# Patient Record
Sex: Male | Born: 1970 | Race: White | Hispanic: No
Health system: Midwestern US, Community
[De-identification: ages and names within clinical notes are randomized; demographics above are authoritative.]

## PROBLEM LIST (undated history)

## (undated) DIAGNOSIS — M5412 Radiculopathy, cervical region: Principal | ICD-10-CM

## (undated) DIAGNOSIS — K219 Gastro-esophageal reflux disease without esophagitis: Secondary | ICD-10-CM

## (undated) DIAGNOSIS — K589 Irritable bowel syndrome without diarrhea: Secondary | ICD-10-CM

## (undated) HISTORY — PX: BACK SURGERY: SHX140

---

## 1999-02-06 ENCOUNTER — Encounter: Payer: Self-pay | Admitting: Neurosurgery

## 1999-02-06 ENCOUNTER — Ambulatory Visit (HOSPITAL_COMMUNITY): Admission: RE | Admit: 1999-02-06 | Discharge: 1999-02-06 | Payer: Self-pay | Admitting: Neurosurgery

## 2004-07-06 ENCOUNTER — Ambulatory Visit: Payer: Self-pay | Admitting: Unknown Physician Specialty

## 2004-11-23 ENCOUNTER — Emergency Department: Payer: Self-pay | Admitting: General Practice

## 2005-02-24 ENCOUNTER — Emergency Department: Payer: Self-pay | Admitting: Unknown Physician Specialty

## 2005-02-24 ENCOUNTER — Other Ambulatory Visit: Payer: Self-pay

## 2005-12-28 ENCOUNTER — Emergency Department: Payer: Self-pay | Admitting: Emergency Medicine

## 2006-03-12 ENCOUNTER — Emergency Department: Payer: Self-pay | Admitting: General Practice

## 2006-07-06 ENCOUNTER — Emergency Department: Payer: Self-pay | Admitting: Internal Medicine

## 2006-07-07 ENCOUNTER — Emergency Department: Payer: Self-pay | Admitting: Emergency Medicine

## 2006-10-04 ENCOUNTER — Emergency Department: Payer: Self-pay | Admitting: Emergency Medicine

## 2006-10-06 ENCOUNTER — Emergency Department: Payer: Self-pay | Admitting: Unknown Physician Specialty

## 2006-10-15 ENCOUNTER — Emergency Department: Payer: Self-pay | Admitting: Emergency Medicine

## 2007-08-25 ENCOUNTER — Emergency Department: Payer: Self-pay | Admitting: Unknown Physician Specialty

## 2007-08-25 ENCOUNTER — Other Ambulatory Visit: Payer: Self-pay

## 2007-09-14 ENCOUNTER — Emergency Department: Payer: Self-pay | Admitting: Emergency Medicine

## 2007-12-31 ENCOUNTER — Emergency Department: Payer: Self-pay | Admitting: Emergency Medicine

## 2008-03-16 ENCOUNTER — Emergency Department: Payer: Self-pay | Admitting: Emergency Medicine

## 2008-03-19 ENCOUNTER — Emergency Department: Payer: Self-pay | Admitting: Emergency Medicine

## 2008-05-07 ENCOUNTER — Emergency Department: Payer: Self-pay | Admitting: Internal Medicine

## 2008-05-07 ENCOUNTER — Emergency Department: Payer: Self-pay | Admitting: Emergency Medicine

## 2008-05-07 ENCOUNTER — Other Ambulatory Visit: Payer: Self-pay

## 2008-07-09 ENCOUNTER — Emergency Department: Payer: Self-pay | Admitting: Emergency Medicine

## 2008-07-23 ENCOUNTER — Emergency Department: Payer: Self-pay | Admitting: Internal Medicine

## 2009-05-22 ENCOUNTER — Emergency Department: Payer: Self-pay

## 2009-08-07 ENCOUNTER — Emergency Department: Payer: Self-pay | Admitting: Emergency Medicine

## 2009-10-22 ENCOUNTER — Observation Stay: Payer: Self-pay | Admitting: Internal Medicine

## 2010-01-01 ENCOUNTER — Emergency Department: Payer: Self-pay | Admitting: Emergency Medicine

## 2012-08-26 ENCOUNTER — Emergency Department: Payer: Self-pay | Admitting: Emergency Medicine

## 2012-08-26 LAB — URINALYSIS, COMPLETE
Bacteria: NONE SEEN
Glucose,UR: NEGATIVE mg/dL (ref 0–75)
Leukocyte Esterase: NEGATIVE
Nitrite: NEGATIVE
Protein: NEGATIVE
RBC,UR: 1 /HPF (ref 0–5)
Specific Gravity: 1.026 (ref 1.003–1.030)
Squamous Epithelial: NONE SEEN
WBC UR: 1 /HPF (ref 0–5)

## 2012-08-26 LAB — COMPREHENSIVE METABOLIC PANEL
Albumin: 4.9 g/dL (ref 3.4–5.0)
Alkaline Phosphatase: 71 U/L (ref 50–136)
Anion Gap: 9 (ref 7–16)
BUN: 13 mg/dL (ref 7–18)
Chloride: 107 mmol/L (ref 98–107)
Creatinine: 1.1 mg/dL (ref 0.60–1.30)
Glucose: 89 mg/dL (ref 65–99)
SGOT(AST): 34 U/L (ref 15–37)
SGPT (ALT): 47 U/L (ref 12–78)

## 2012-08-26 LAB — CBC
HCT: 42.4 % (ref 40.0–52.0)
MCHC: 33.9 g/dL (ref 32.0–36.0)
MCV: 85 fL (ref 80–100)
Platelet: 263 10*3/uL (ref 150–440)
RDW: 13.4 % (ref 11.5–14.5)
WBC: 15.9 10*3/uL — ABNORMAL HIGH (ref 3.8–10.6)

## 2012-08-26 LAB — TROPONIN I: Troponin-I: <0.02 ng/mL

## 2013-02-03 ENCOUNTER — Emergency Department: Payer: Self-pay | Admitting: Internal Medicine

## 2013-06-30 ENCOUNTER — Emergency Department: Payer: Self-pay | Admitting: Emergency Medicine

## 2013-06-30 LAB — URINALYSIS, COMPLETE
Ketone: NEGATIVE
Leukocyte Esterase: NEGATIVE
Nitrite: NEGATIVE
Ph: 6 (ref 4.5–8.0)
Specific Gravity: 1.023 (ref 1.003–1.030)

## 2013-06-30 LAB — COMPREHENSIVE METABOLIC PANEL
Anion Gap: 4 — ABNORMAL LOW (ref 7–16)
Bilirubin,Total: 0.3 mg/dL (ref 0.2–1.0)
Chloride: 105 mmol/L (ref 98–107)
EGFR (African American): >60
EGFR (Non-African Amer.): >60
Glucose: 115 mg/dL — ABNORMAL HIGH (ref 65–99)
SGPT (ALT): 56 U/L (ref 12–78)

## 2013-06-30 LAB — CBC
HCT: 37.9 % — ABNORMAL LOW (ref 40.0–52.0)
HGB: 13.4 g/dL (ref 13.0–18.0)
MCH: 29.3 pg (ref 26.0–34.0)
MCHC: 35.4 g/dL (ref 32.0–36.0)
RBC: 4.58 10*6/uL (ref 4.40–5.90)
WBC: 8.7 10*3/uL (ref 3.8–10.6)

## 2013-06-30 LAB — DRUG SCREEN, URINE
Barbiturates, Ur Screen: NEGATIVE (ref ?–200)
MDMA (Ecstasy)Ur Screen: NEGATIVE (ref ?–500)
Methadone, Ur Screen: NEGATIVE (ref ?–300)
Tricyclic, Ur Screen: NEGATIVE (ref ?–1000)

## 2013-06-30 LAB — CK TOTAL AND CKMB (NOT AT ARMC)
CK, Total: 227 U/L (ref 35–232)
CK-MB: 3.7 ng/mL — ABNORMAL HIGH (ref 0.5–3.6)

## 2013-06-30 LAB — TROPONIN I: Troponin-I: <0.02 ng/mL

## 2013-07-04 ENCOUNTER — Ambulatory Visit (INDEPENDENT_AMBULATORY_CARE_PROVIDER_SITE_OTHER): Payer: Self-pay | Admitting: Physician Assistant

## 2013-07-04 ENCOUNTER — Encounter: Payer: Self-pay | Admitting: Physician Assistant

## 2013-07-04 VITALS — BP 110/80 | HR 69 | Ht 67.0 in | Wt 141.2 lb

## 2013-07-04 DIAGNOSIS — R0602 Shortness of breath: Secondary | ICD-10-CM

## 2013-07-04 DIAGNOSIS — M79609 Pain in unspecified limb: Secondary | ICD-10-CM

## 2013-07-04 DIAGNOSIS — M79602 Pain in left arm: Secondary | ICD-10-CM

## 2013-07-04 NOTE — Patient Instructions (Signed)
Dr. Mariah Milling will review your stress test findings and will update you with the results. Any further recommendations will be made at that time.

## 2013-07-04 NOTE — Procedures (Signed)
Exercise Treadmill Test  Pre-Exercise Testing Evaluation Rhythm: normal sinus  Rate: 72   PR:  .12 QRS:  .06  QT:  .34      ST Segments:  no significant ST changes at rest     Test  Exercise Tolerance Test Ordering MD: Dossie Arbour, MD  Interpreting MD: Dossie Arbour, MD      Indication for ETT: dyspnea, bilateral arm numbness  Contraindication to ETT: No   Stress Modality: exercise - treadmill  Cardiac Imaging Performed: non   Protocol: standard Bruce - maximal  Max BP:  172/78  Max MPHR (bpm):  176 85% MPR (bpm):  149  MPHR obtained (bpm):  173 % MPHR obtained:  98  Reached 85% MPHR (min:sec):  4:23 Total Exercise Time (min-sec): 6:59  Workload in METS:  10.1   Reason ETT Terminated:  patient's desire to stop    ST Segment Analysis At Rest: normal ST segments - no evidence of significant ST depression With Exercise: diffuse upsloping ST depressions  Other Information Arrhythmia:  No Angina during ETT:  absent (0) Quality of ETT:  diagnostic    Comments:  FINAL IMPRESSION: Normal exercise stress test. No significant EKG changes concerning for ischemia. Excellent exercise tolerance.

## 2013-09-05 ENCOUNTER — Emergency Department: Payer: Self-pay | Admitting: Emergency Medicine

## 2013-09-05 LAB — URINALYSIS, COMPLETE
Blood: NEGATIVE
Glucose,UR: NEGATIVE mg/dL (ref 0–75)
Ketone: NEGATIVE
Leukocyte Esterase: NEGATIVE
Nitrite: NEGATIVE
Protein: NEGATIVE
RBC,UR: <1 /HPF (ref 0–5)
Specific Gravity: 1.019 (ref 1.003–1.030)
Squamous Epithelial: NONE SEEN

## 2013-09-05 LAB — BASIC METABOLIC PANEL
Calcium, Total: 9.2 mg/dL (ref 8.5–10.1)
Chloride: 105 mmol/L (ref 98–107)
Co2: 21 mmol/L (ref 21–32)
Glucose: 86 mg/dL (ref 65–99)
Sodium: 137 mmol/L (ref 136–145)

## 2013-09-05 LAB — CBC WITH DIFFERENTIAL/PLATELET
Basophil %: 0.7 %
Eosinophil #: 0.3 10*3/uL (ref 0.0–0.7)
Eosinophil %: 3.9 %
HCT: 38.4 % — ABNORMAL LOW (ref 40.0–52.0)
Lymphocyte #: 1.3 10*3/uL (ref 1.0–3.6)
MCHC: 35.1 g/dL (ref 32.0–36.0)
Monocyte #: 1.2 x10 3/mm — ABNORMAL HIGH (ref 0.2–1.0)
Monocyte %: 17.7 %
Platelet: 212 10*3/uL (ref 150–440)
RDW: 13.6 % (ref 11.5–14.5)
WBC: 6.8 10*3/uL (ref 3.8–10.6)

## 2013-09-05 LAB — TROPONIN I: Troponin-I: <0.02 ng/mL

## 2013-10-11 ENCOUNTER — Emergency Department: Payer: Self-pay | Admitting: Emergency Medicine

## 2013-10-11 LAB — CBC WITH DIFFERENTIAL/PLATELET
BASOS ABS: 0 10*3/uL (ref 0.0–0.1)
BASOS PCT: 0.3 %
EOS PCT: 0.2 %
Eosinophil #: 0 10*3/uL (ref 0.0–0.7)
HCT: 38.9 % — ABNORMAL LOW (ref 40.0–52.0)
HGB: 13.4 g/dL (ref 13.0–18.0)
Lymphocyte #: 0.6 10*3/uL — ABNORMAL LOW (ref 1.0–3.6)
Lymphocyte %: 4.8 %
MCH: 28.4 pg (ref 26.0–34.0)
MCHC: 34.5 g/dL (ref 32.0–36.0)
MCV: 82 fL (ref 80–100)
MONO ABS: 0.9 x10 3/mm (ref 0.2–1.0)
MONOS PCT: 6.7 %
NEUTROS PCT: 88 %
Neutrophil #: 11.6 10*3/uL — ABNORMAL HIGH (ref 1.4–6.5)
Platelet: 227 10*3/uL (ref 150–440)
RBC: 4.73 10*6/uL (ref 4.40–5.90)
RDW: 13.6 % (ref 11.5–14.5)
WBC: 13.2 10*3/uL — ABNORMAL HIGH (ref 3.8–10.6)

## 2013-10-11 LAB — COMPREHENSIVE METABOLIC PANEL
ALBUMIN: 4.1 g/dL (ref 3.4–5.0)
ALK PHOS: 81 U/L
ALT: 112 U/L — AB (ref 12–78)
AST: 54 U/L — AB (ref 15–37)
Anion Gap: 8 (ref 7–16)
BILIRUBIN TOTAL: 0.5 mg/dL (ref 0.2–1.0)
BUN: 11 mg/dL (ref 7–18)
CALCIUM: 8.8 mg/dL (ref 8.5–10.1)
Chloride: 106 mmol/L (ref 98–107)
Co2: 22 mmol/L (ref 21–32)
Creatinine: 1.12 mg/dL (ref 0.60–1.30)
Glucose: 96 mg/dL (ref 65–99)
Osmolality: 271 (ref 275–301)
POTASSIUM: 3.7 mmol/L (ref 3.5–5.1)
Sodium: 136 mmol/L (ref 136–145)
TOTAL PROTEIN: 8.3 g/dL — AB (ref 6.4–8.2)

## 2013-10-11 LAB — LIPASE, BLOOD: LIPASE: 172 U/L (ref 73–393)

## 2014-04-02 NOTE — Telephone Encounter (Signed)
This encounter was created in error - please disregard.

## 2014-04-05 ENCOUNTER — Emergency Department: Payer: Self-pay | Admitting: Emergency Medicine

## 2014-08-14 ENCOUNTER — Emergency Department: Payer: Self-pay | Admitting: Emergency Medicine

## 2014-09-13 IMAGING — CR DG CHEST 1V PORT
1 series · 1 of 1 positions shown · non-contrast
Comparison: none

REASON FOR EXAM: weak and dizzy
COMMENTS:

PROCEDURE:     DXR - DXR PORTABLE CHEST SINGLE VIEW  - June 30, 2013  [DATE]
RESULT:     Comparison made to prior study of 9 [DATE]. Mediastinum and
hilar structures are normal. Lungs are clear. Heart size normal.

[ap]
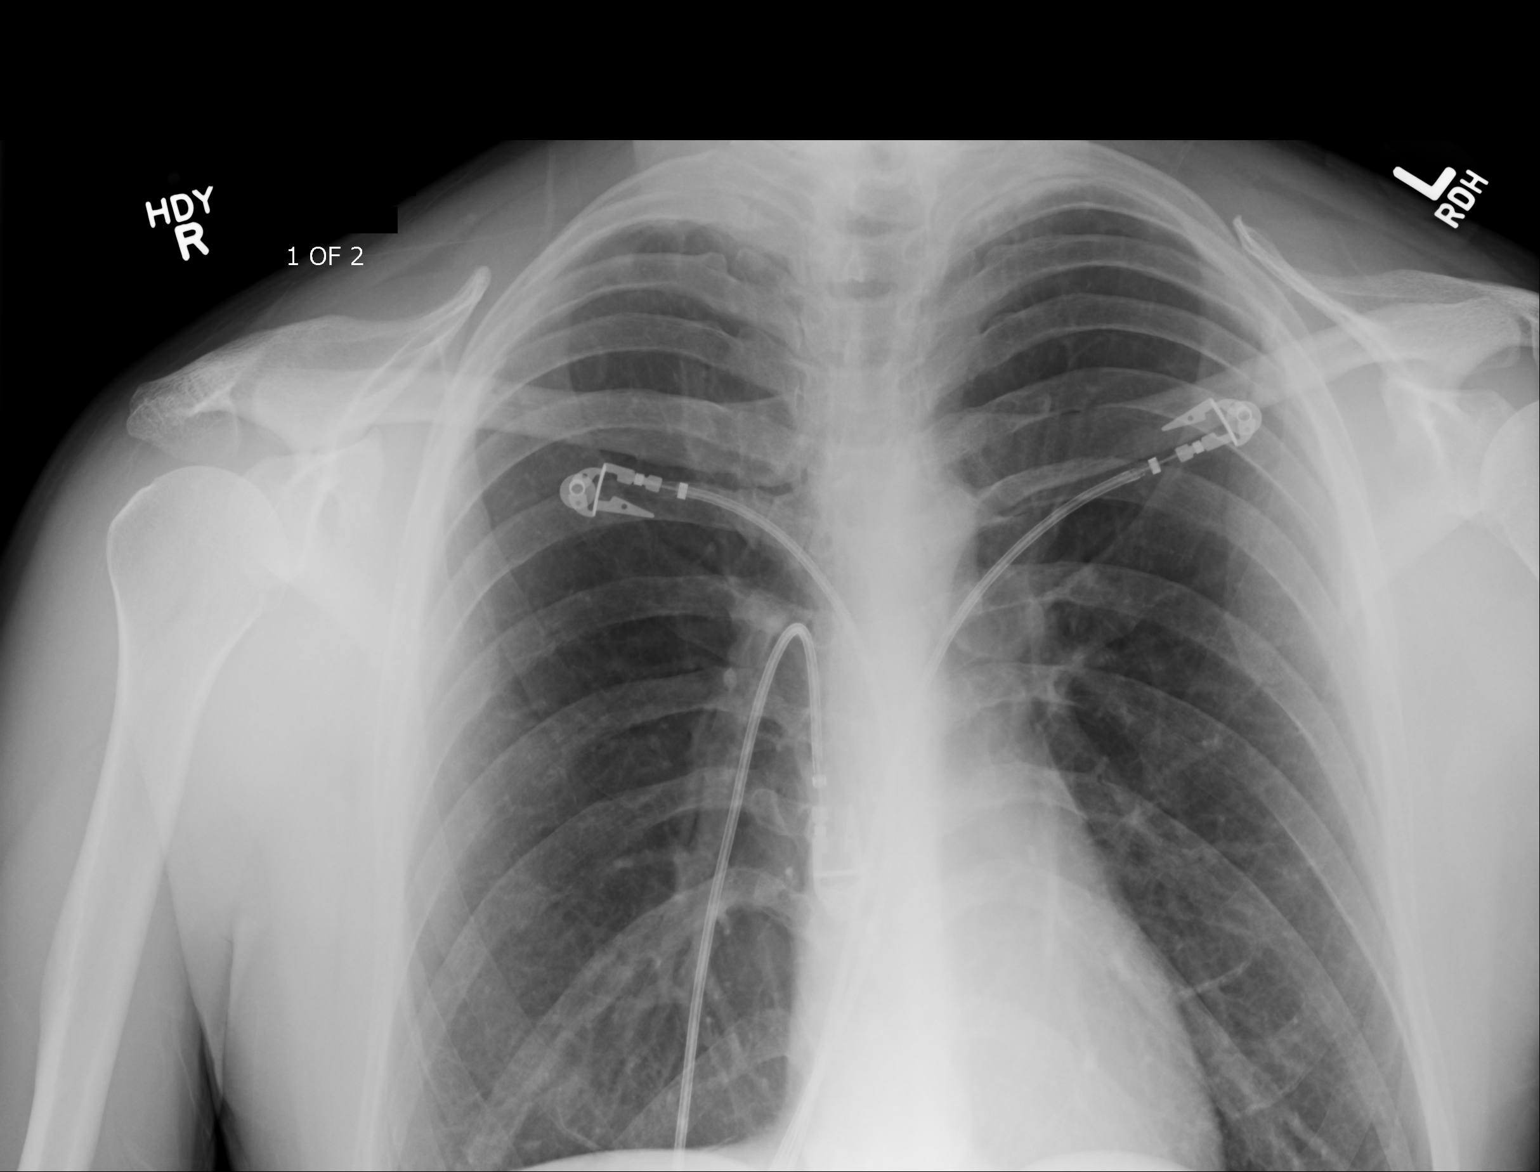

[1 of 1 positions shown; findings below may reference images not displayed]

IMPRESSION: No acute abnormality.

## 2014-10-08 ENCOUNTER — Emergency Department: Payer: Self-pay | Admitting: Emergency Medicine

## 2015-08-10 ENCOUNTER — Emergency Department
Admission: EM | Admit: 2015-08-10 | Discharge: 2015-08-10 | Disposition: A | Discharge status: Home / Self Care | Payer: Self-pay | Attending: Emergency Medicine | Admitting: Emergency Medicine

## 2015-08-10 ENCOUNTER — Emergency Department: Payer: Self-pay

## 2015-08-10 ENCOUNTER — Encounter: Payer: Self-pay | Admitting: Emergency Medicine

## 2015-08-10 DIAGNOSIS — Y998 Other external cause status: Secondary | ICD-10-CM | POA: Insufficient documentation

## 2015-08-10 DIAGNOSIS — Y9301 Activity, walking, marching and hiking: Secondary | ICD-10-CM | POA: Insufficient documentation

## 2015-08-10 DIAGNOSIS — Y9289 Other specified places as the place of occurrence of the external cause: Secondary | ICD-10-CM | POA: Insufficient documentation

## 2015-08-10 DIAGNOSIS — W1839XA Other fall on same level, initial encounter: Secondary | ICD-10-CM | POA: Insufficient documentation

## 2015-08-10 DIAGNOSIS — Z79899 Other long term (current) drug therapy: Secondary | ICD-10-CM | POA: Insufficient documentation

## 2015-08-10 DIAGNOSIS — S63501A Unspecified sprain of right wrist, initial encounter: Secondary | ICD-10-CM | POA: Insufficient documentation

## 2015-08-10 MED ORDER — NAPROXEN 500 MG PO TBEC: 500.0000 mg | DELAYED_RELEASE_TABLET | Freq: Two times a day (BID) | ORAL | Status: DC

## 2015-08-10 NOTE — ED Notes (Signed)
Larey SeatFell about 3 months ago.  C/o right arm pain and just not improving.

## 2015-08-10 NOTE — ED Notes (Signed)
Pt sts that he fell 3 months ago and is having pain in R arm.  Pt sts that he never had arm checked.  Pt sts that he has pain in wrist when he lifts or twists R wrist.  Pt sts that he thought it was muscular and pain would go away.

## 2015-08-10 NOTE — ED Provider Notes (Signed)
[facility] Emergency Department Provider Note ____________________________________________  Time seen: 1810  I have reviewed the triage vital signs and the nursing notes.  HISTORY  Chief Complaint  Fall  HPI Calvin Yoder. is a 44 y.o. male see the ED for evaluation of continued intermittent wrist pain following a fall about 3 months prior. He describes he was walking up the [place] he began to fall backwards. He extended his right hand to avoid falling on the concrete. He noted from that time he had immediate pain that extended from the right wrist up to the elbow. He is been managing it in the interim with Tylenol or Motrin but notes increased pain with pronation and supination of the wrist. He denies any significant swelling at the time of the injury. He has not sought care for this injury since its occurrence. 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  History reviewed. No pertinent past medical history.  There are no active problems to display for this patient.   Past Surgical History  Procedure Laterality Date  . Back surgery      Current Outpatient Rx  Name  Route  Sig  Dispense  Refill  . Magnesium Hydroxide (MILK OF MAGNESIA PO)   Oral   Take by mouth every other day.         . naproxen (EC NAPROSYN) 500 MG EC tablet   Oral   Take 1 tablet (500 mg total) by mouth 2 (two) times daily with a meal.   30 tablet   0   . omeprazole (PRILOSEC) 20 MG capsule   Oral   Take 20 mg by mouth daily.          Allergies Review of patient's allergies indicates no known allergies.  Family History  Problem Relation Age of Onset  . Heart failure Mother   . Hypertension Mother   . Heart failure Father     Social History Social History  Substance Use Topics  . Smoking status: Never Smoker   . Smokeless tobacco: None  . Alcohol Use: No    Review of Systems  Constitutional: Negative for fever. Eyes: Negative for visual changes. ENT: Negative for sore throat. Cardiovascular: Negative for chest pain. Respiratory: Negative for shortness of breath. Gastrointestinal: Negative for abdominal pain, vomiting and diarrhea. Genitourinary: Negative for dysuria. Musculoskeletal: Negative for back pain. Right wrist pain as above. Skin: Negative for rash. Neurological: Negative for headaches, focal weakness or numbness. ____________________________________________  PHYSICAL EXAM:  VITAL SIGNS: ED Triage Vitals  Enc Vitals Group     BP 08/10/15 1706 142/74 mmHg     Pulse Rate 08/10/15 1706 61     Resp 08/10/15 1706 18     Temp 08/10/15 1706 98 F (36.7 C)     Temp src --      SpO2 08/10/15 1706 98 %     Weight 08/10/15 1706 151 lb (68.493 kg)     Height 08/10/15 1706  (1.702 m)     Head Cir --      Peak Flow --      Pain Score 08/10/15 1707 0     Pain Loc --      Pain Edu? --      Excl. in GC? --    Constitutional: Alert and oriented. Well appearing and in no distress. Head: Normocephalic and atraumatic.  Eyes: Conjunctivae are normal. PERRL. Normal extraocular movements      Ears: Canals clear. TMs intact bilaterally.   Nose: No congestion/rhinorrhea.   Mouth/Throat: Mucous membranes are moist.   Neck: Supple. No thyromegaly. Hematological/Lymphatic/Immunological: No cervical lymphadenopathy. Cardiovascular: Normal rate, regular rhythm.  Respiratory: Normal respiratory effort. No wheezes/rales/rhonchi. Gastrointestinal: Soft and nontender. No distention. Musculoskeletal: Upper extremity without any obvious deformity, effusion, or swelling from the fingers to the elbow. Patient with normal wrist flexion and extension however extension does elicit pain to the dorsum of the wrist. He also notes intermittent pain with supination of the wrist. He is able to demonstrate a normal elbow flexion and  extension range he is without tenderness to gross palpation over the wrist, forearm, or elbow. Nontender with normal range of motion in all extremities.  Neurologic:  The nurse 2 through 12 grossly intact. Normal composite fist demonstrated. Normal intrinsic and opposition testing. Normal gait without ataxia. Normal speech and language. No gross focal neurologic deficits are appreciated. Skin:  Skin is warm, dry and intact. No rash noted. Psychiatric: Mood and affect are normal. Patient exhibits appropriate insight and judgment. ____________________________________________   RADIOLOGY Right Wrist IMPRESSION: Negative radiographs of the right wrist.  Right Elbow IMPRESSION: Negative radiographs of the right elbow.  I, Daelin Haste, Charlesetta IvoryJenise V Bacon, personally viewed and evaluated these images (plain radiographs) as part of my medical decision making.  ____________________________________________  PROCEDURES  Wrist Cock-up Splint ____________________________________________  INITIAL IMPRESSION / ASSESSMENT AND PLAN / ED COURSE  Patient discharged with instructions of a wrist sprain. He is provided with his negative x-ray results. He'll be fitted with a wrist cock-up splint and provided with a prescription for EC Naprosyn to dose as directed. He is encouraged to follow-up with Dr. Ernest PineHooten should symptoms continue. ____________________________________________  FINAL CLINICAL IMPRESSION(S) / ED DIAGNOSES  Final diagnoses:  Wrist sprain, right, initial encounter      Lissa HoardJenise V Bacon Warrick Llera, PA-C 08/10/15 1920  Jeanmarie PlantJames A McShane, MD 08/10/15 2123

## 2015-08-10 NOTE — Discharge Instructions (Signed)
Wrist Sprain With Rehab A sprain is an injury in which a ligament that maintains the proper alignment of a joint is partially or completely torn. The ligaments of the wrist are susceptible to sprains. Sprains are classified into three categories. Grade 1 sprains cause pain, but the tendon is not lengthened. Grade 2 sprains include a lengthened ligament because the ligament is stretched or partially ruptured. With grade 2 sprains there is still function, although the function may be diminished. Grade 3 sprains are characterized by a complete tear of the tendon or muscle, and function is usually impaired. SYMPTOMS   Pain tenderness, inflammation, and/or bruising (contusion) of the injury.  A "pop" or tear felt and/or heard at the time of injury.  Decreased wrist function. CAUSES  A wrist sprain occurs when a force is placed on one or more ligaments that is greater than it/they can withstand. Common mechanisms of injury include:  Catching a ball with your hands.  Repetitive and/ or strenuous extension or flexion of the wrist. RISK INCREASES WITH:  Previous wrist injury.  Contact sports (boxing or wrestling).  Activities in which falling is common.  Poor strength and flexibility.  Improperly fitted or padded protective equipment. PREVENTION  Warm up and stretch properly before activity.  Allow for adequate recovery between workouts.  Maintain physical fitness:  Strength, flexibility, and endurance.  Cardiovascular fitness.  Protect the wrist joint by limiting its motion with the use of taping, braces, or splints.  Protect the wrist after injury for 6 to 12 months. PROGNOSIS  The prognosis for wrist sprains depends on the degree of injury. Grade 1 sprains require 2 to 6 weeks of treatment. Grade 2 sprains require 6 to 8 weeks of treatment, and grade 3 sprains require up to 12 weeks.  RELATED COMPLICATIONS   Prolonged healing time, if improperly treated or  re-injured.  Recurrent symptoms that result in a chronic problem.  Injury to nearby structures (bone, cartilage, nerves, or tendons).  Arthritis of the wrist.  Inability to compete in athletics at a high level.  Wrist stiffness or weakness.  Progression to a complete rupture of the ligament. TREATMENT  Treatment initially involves resting from any activities that aggravate the symptoms, and the use of ice and medications to help reduce pain and inflammation. Your caregiver may recommend immobilizing the wrist for a period of time in order to reduce stress on the ligament and allow for healing. After immobilization it is important to perform strengthening and stretching exercises to help regain strength and a full range of motion. These exercises may be completed at home or with a therapist. Surgery is not usually required for wrist sprains, unless the ligament has been ruptured (grade 3 sprain). MEDICATION   If pain medication is necessary, then nonsteroidal anti-inflammatory medications, such as aspirin and ibuprofen, or other minor pain relievers, such as acetaminophen, are often recommended.  Do not take pain medication for 7 days before surgery.  Prescription pain relievers may be given if deemed necessary by your caregiver. Use only as directed and only as much as you need. HEAT AND COLD  Cold treatment (icing) relieves pain and reduces inflammation. Cold treatment should be applied for 10 to 15 minutes every 2 to 3 hours for inflammation and pain and immediately after any activity that aggravates your symptoms. Use ice packs or massage the area with a piece of ice (ice massage).  Heat treatment may be used prior to performing the stretching and strengthening activities prescribed by your  caregiver, physical therapist, or athletic trainer. Use a heat pack or soak your injury in warm water. SEEK MEDICAL CARE IF:  Treatment seems to offer no benefit, or the condition worsens.  Any  medications produce adverse side effects. EXERCISES RANGE OF MOTION (ROM) AND STRETCHING EXERCISES - Wrist Sprain  These exercises may help you when beginning to rehabilitate your injury. Your symptoms may resolve with or without further involvement from your physician, physical therapist or athletic trainer. While completing these exercises, remember:   Restoring tissue flexibility helps normal motion to return to the joints. This allows healthier, less painful movement and activity.  An effective stretch should be held for at least 30 seconds.  A stretch should never be painful. You should only feel a gentle lengthening or release in the stretched tissue. RANGE OF MOTION - Wrist Flexion, Active-Assisted  Extend your right / left elbow with your fingers pointing down.*  Gently pull the back of your hand towards you until you feel a gentle stretch on the top of your forearm.  Hold this position for __________ seconds. Repeat __________ times. Complete this exercise __________ times per day.  *If directed by your physician, physical therapist or athletic trainer, complete this stretch with your elbow bent rather than extended. RANGE OF MOTION - Wrist Extension, Active-Assisted  Extend your right / left elbow and turn your palm upwards.*  Gently pull your palm/fingertips back so your wrist extends and your fingers point more toward the ground.  You should feel a gentle stretch on the inside of your forearm.  Hold this position for __________ seconds. Repeat __________ times. Complete this exercise __________ times per day. *If directed by your physician, physical therapist or athletic trainer, complete this stretch with your elbow bent, rather than extended. RANGE OF MOTION - Supination, Active  Stand or sit with your elbows at your side. Bend your right / left elbow to 90 degrees.  Turn your palm upward until you feel a gentle stretch on the inside of your forearm.  Hold this  position for __________ seconds. Slowly release and return to the starting position. Repeat __________ times. Complete this stretch __________ times per day.  RANGE OF MOTION - Pronation, Active  Stand or sit with your elbows at your side. Bend your right / left elbow to 90 degrees.  Turn your palm downward until you feel a gentle stretch on the top of your forearm.  Hold this position for __________ seconds. Slowly release and return to the starting position. Repeat __________ times. Complete this stretch __________ times per day.  STRETCH - Wrist Flexion  Place the back of your right / left hand on a tabletop leaving your elbow slightly bent. Your fingers should point away from your body.  Gently press the back of your hand down onto the table by straightening your elbow. You should feel a stretch on the top of your forearm.  Hold this position for __________ seconds. Repeat __________ times. Complete this stretch __________ times per day.  STRETCH - Wrist Extension  Place your right / left fingertips on a tabletop leaving your elbow slightly bent. Your fingers should point backwards.  Gently press your fingers and palm down onto the table by straightening your elbow. You should feel a stretch on the inside of your forearm.  Hold this position for __________ seconds. Repeat __________ times. Complete this stretch __________ times per day.  STRENGTHENING EXERCISES - Wrist Sprain These exercises may help you when beginning to rehabilitate your injury.  They may resolve your symptoms with or without further involvement from your physician, physical therapist or athletic trainer. While completing these exercises, remember:   Muscles can gain both the endurance and the strength needed for everyday activities through controlled exercises.  Complete these exercises as instructed by your physician, physical therapist or athletic trainer. Progress with the resistance and repetition exercises  only as your caregiver advises. STRENGTH - Wrist Flexors  Sit with your right / left forearm palm-up and fully supported. Your elbow should be resting below the height of your shoulder. Allow your wrist to extend over the edge of the surface.  Loosely holding a __________ weight or a piece of rubber exercise band/tubing, slowly curl your hand up toward your forearm.  Hold this position for __________ seconds. Slowly lower the wrist back to the starting position in a controlled manner. Repeat __________ times. Complete this exercise __________ times per day.  STRENGTH - Wrist Extensors  Sit with your right / left forearm palm-down and fully supported. Your elbow should be resting below the height of your shoulder. Allow your wrist to extend over the edge of the surface.  Loosely holding a __________ weight or a piece of rubber exercise band/tubing, slowly curl your hand up toward your forearm.  Hold this position for __________ seconds. Slowly lower the wrist back to the starting position in a controlled manner. Repeat __________ times. Complete this exercise __________ times per day.  STRENGTH - Ulnar Deviators  Stand with a ____________________ weight in your right / left hand, or sit holding on to the rubber exercise band/tubing with your opposite arm supported.  Move your wrist so that your pinkie travels toward your forearm and your thumb moves away from your forearm.  Hold this position for __________ seconds and then slowly lower the wrist back to the starting position. Repeat __________ times. Complete this exercise __________ times per day STRENGTH - Radial Deviators  Stand with a ____________________ weight in your  right / left hand, or sit holding on to the rubber exercise band/tubing with your arm supported.  Raise your hand upward in front of you or pull up on the rubber tubing.  Hold this position for __________ seconds and then slowly lower the wrist back to the  starting position. Repeat __________ times. Complete this exercise __________ times per day. STRENGTH - Forearm Supinators  Sit with your right / left forearm supported on a table, keeping your elbow below shoulder height. Rest your hand over the edge, palm down.  Gently grip a hammer or a soup ladle.  Without moving your elbow, slowly turn your palm and hand upward to a "thumbs-up" position.  Hold this position for __________ seconds. Slowly return to the starting position. Repeat __________ times. Complete this exercise __________ times per day.  STRENGTH - Forearm Pronators  Sit with your right / left forearm supported on a table, keeping your elbow below shoulder height. Rest your hand over the edge, palm up.  Gently grip a hammer or a soup ladle.  Without moving your elbow, slowly turn your palm and hand upward to a "thumbs-up" position.  Hold this position for __________ seconds. Slowly return to the starting position. Repeat __________ times. Complete this exercise __________ times per day.  STRENGTH - Grip  Grasp a tennis ball, a dense sponge, or a large, rolled sock in your hand.  Squeeze as hard as you can without increasing any pain.  Hold this position for __________ seconds. Release your grip slowly.  Repeat __________ times. Complete this exercise __________ times per day.    This information is not intended to replace advice given to you by your health care provider. Make sure you discuss any questions you have with your health care provider.   Document Released: 09/20/2005 Document Revised: 06/11/2015 Document Reviewed: 01/02/2009 [employer] Interactive Patient Education 2016 [employer]   Wear the wrist splint as directed.  Take the prescription anti-inflammatory as needed. Apply ice to reduce symptoms. Follow-up with Dr. Ernest PineHooten for ongoing symptoms.

## 2015-11-13 ENCOUNTER — Encounter: Payer: Self-pay | Admitting: Emergency Medicine

## 2015-11-13 ENCOUNTER — Emergency Department
Admission: EM | Admit: 2015-11-13 | Discharge: 2015-11-13 | Disposition: A | Discharge status: Home / Self Care | Payer: BLUE CROSS/BLUE SHIELD | Attending: Emergency Medicine | Admitting: Emergency Medicine

## 2015-11-13 ENCOUNTER — Other Ambulatory Visit: Payer: Self-pay

## 2015-11-13 DIAGNOSIS — J40 Bronchitis, not specified as acute or chronic: Secondary | ICD-10-CM

## 2015-11-13 DIAGNOSIS — Z791 Long term (current) use of non-steroidal anti-inflammatories (NSAID): Secondary | ICD-10-CM | POA: Diagnosis not present

## 2015-11-13 DIAGNOSIS — R0981 Nasal congestion: Secondary | ICD-10-CM | POA: Diagnosis present

## 2015-11-13 DIAGNOSIS — H6993 Unspecified Eustachian tube disorder, bilateral: Secondary | ICD-10-CM | POA: Diagnosis not present

## 2015-11-13 DIAGNOSIS — H6983 Other specified disorders of Eustachian tube, bilateral: Secondary | ICD-10-CM

## 2015-11-13 DIAGNOSIS — J209 Acute bronchitis, unspecified: Secondary | ICD-10-CM | POA: Diagnosis not present

## 2015-11-13 DIAGNOSIS — Z79899 Other long term (current) drug therapy: Secondary | ICD-10-CM | POA: Diagnosis not present

## 2015-11-13 HISTORY — DX: Irritable bowel syndrome, unspecified: K58.9

## 2015-11-13 HISTORY — DX: Gastro-esophageal reflux disease without esophagitis: K21.9

## 2015-11-13 MED ORDER — CETIRIZINE HCL 10 MG PO TABS: 10.0000 mg | ORAL_TABLET | Freq: Every day | ORAL | Status: DC

## 2015-11-13 MED ORDER — PREDNISONE 10 MG PO TABS: 10.0000 mg | ORAL_TABLET | ORAL | Status: DC

## 2015-11-13 MED ORDER — ALBUTEROL SULFATE HFA 108 (90 BASE) MCG/ACT IN AERS: 2.0000 | INHALATION_SPRAY | RESPIRATORY_TRACT | Status: DC | PRN

## 2015-11-13 MED ORDER — AZITHROMYCIN 250 MG PO TABS: ORAL_TABLET | ORAL | Status: DC

## 2015-11-13 MED ORDER — FLUTICASONE PROPIONATE 50 MCG/ACT NA SUSP: 1.0000 | Freq: Two times a day (BID) | NASAL | Status: DC

## 2015-11-13 NOTE — ED Provider Notes (Signed)
[facility] Emergency Department Provider Note  ____________________________________________  Time seen: Approximately 3:28 PM  I have reviewed the triage vital signs and the nursing notes.   HISTORY  Chief Complaint URI    HPI Calvin Yoder. is a 45 y.o. male who presents emergency department complaining of nasal congestion and cough 1 week. Patient states that symptoms began insidiously with some nasal congestion. He denies any fevers or chills, sore throat and any time. He states that he began to have some coughing that has greatly increased and is now his main complaint. Patient states the cough is dry in nature. He denies any headache, visual acuity changes, chest pain, shortness of breath, abdominal pain, nausea or vomiting.   Past Medical History  Diagnosis Date  . GERD (gastroesophageal reflux disease)   . IBS (irritable bowel syndrome)     There are no active problems to display for this patient.   Past Surgical History  Procedure Laterality Date  . Back surgery      Current Outpatient Rx  Name  Route  Sig  Dispense  Refill  . albuterol (PROVENTIL HFA;VENTOLIN HFA) 108 (90 Base) MCG/ACT inhaler   Inhalation   Inhale 2 puffs into the lungs every 4 (four) hours as needed for wheezing or shortness of breath.   1 Inhaler   0   . azithromycin (ZITHROMAX Z-PAK) 250 MG tablet      Take 2 tablets (500 mg) on  Day 1,  followed by 1 tablet (250 mg) once daily on Days 2 through 5.   6 each   0   . cetirizine (ZYRTEC) 10 MG tablet   Oral   Take 1 tablet (10 mg total) by mouth daily.   30 tablet   0   . fluticasone (FLONASE) 50 MCG/ACT nasal spray   Each Nare   Place 1 spray into both nostrils 2 (two) times daily.   16 g   0   . Magnesium Hydroxide (MILK OF MAGNESIA PO)   Oral   Take by mouth every other day.         . naproxen (EC NAPROSYN) 500 MG EC tablet   Oral   Take 1 tablet (500 mg total) by mouth 2 (two) times daily  with a meal.   30 tablet   0   . omeprazole (PRILOSEC) 20 MG capsule   Oral   Take 20 mg by mouth daily.         . predniSONE (DELTASONE) 10 MG tablet   Oral   Take 1 tablet (10 mg total) by mouth as directed.   21 tablet   0     Take on a daily basis of 6, 5, 4, 3, 2, 1     Allergies Review of patient's allergies indicates no known allergies.  Family History  Problem Relation Age of Onset  . Heart failure Mother   . Hypertension Mother   . Heart failure Father     Social History Social History  Substance Use Topics  . Smoking status: Never Smoker   . Smokeless tobacco: None  . Alcohol Use: No     Review of Systems  Constitutional: No fever/chills Eyes: No visual changes. No discharge ENT: No sore throat. Positive for nasal congestion. Cardiovascular: no chest pain. Respiratory: Positive for cough. No SOB. Gastrointestinal:   No nausea, no vomiting.   Skin: Negative for rash. Neurological: Negative for headaches, focal weakness or numbness. 10-point ROS otherwise negative.  ____________________________________________   PHYSICAL EXAM:  VITAL SIGNS: ED Triage Vitals  Enc Vitals Group     BP 11/13/15 1448 119/79 mmHg     Pulse Rate 11/13/15 1448 83     Resp 11/13/15 1448 16     Temp 11/13/15 1448 98.1 F (36.7 C)     Temp Source 11/13/15 1448 Oral     SpO2 11/13/15 1448 99 %     Weight 11/13/15 1448 158 lb (71.668 kg)     Height 11/13/15 1448  (1.702 m)     Head Cir --      Peak Flow --      Pain Score 11/13/15 1449 10     Pain Loc --      Pain Edu? --      Excl. in GC? --      Constitutional: Alert and oriented. Well appearing and in no acute distress. Eyes: Conjunctivae are normal. PERRL. EOMI. Head: Atraumatic. ENT:      Ears: EACs are unremarkable bilaterally. TMs are mildly bulging bilaterally but no air-fluid levels appreciated.      Nose: Moderate clear congestion/rhinnorhea.      Mouth/Throat: Mucous membranes are moist.  Oropharynx is nonerythematous and nonedematous. Tonsils are unremarkable bilaterally. Neck: No stridor.   Hematological/Lymphatic/Immunilogical: No cervical lymphadenopathy. Cardiovascular: Normal rate, regular rhythm. Normal S1 and S2.  Good peripheral circulation. Respiratory: Normal respiratory effort without tachypnea or retractions. Lungs with diffuse coarse breath sounds. Mild expiratory wheezing is noted over scattered distribution. No rales or rhonchi. No decreased or absent breath sounds. Good air entry into the bases. Neurologic:  Normal speech and language. No gross focal neurologic deficits are appreciated.  Skin:  Skin is warm, dry and intact. No rash noted. Psychiatric: Mood and affect are normal. Speech and behavior are normal. Patient exhibits appropriate insight and judgement.   ____________________________________________   LABS (all labs ordered are listed, but only abnormal results are displayed)  Labs Reviewed - No data to display ____________________________________________  EKG  EKG reveals normal sinus rhythm and no ST elevations or depressions noted. PR, QRS, QT interval within normal limits. No Q waves or delta waves identified. ____________________________________________  RADIOLOGY   No results found.  ____________________________________________    PROCEDURES  Procedure(s) performed:       Medications - No data to display   ____________________________________________   INITIAL IMPRESSION / ASSESSMENT AND PLAN / ED COURSE  Pertinent labs & imaging results that were available during my care of the patient were reviewed by me and considered in my medical decision making (see chart for details).  Patient's diagnosis is consistent with bronchitis with eustachian tube dysfunction. Symptoms likely began as a upper respiratory virus and have progressed.. Patient will be discharged home with prescriptions for Flonase and Zyrtec, albuterol,  prednisone, azithromycin. Patient is to follow up with primary care provider if symptoms persist past this treatment course. Patient is given ED precautions to return to the ED for any worsening or new symptoms.     ____________________________________________  FINAL CLINICAL IMPRESSION(S) / ED DIAGNOSES  Final diagnoses:  Bronchitis  Eustachian tube dysfunction, bilateral      NEW MEDICATIONS STARTED DURING THIS VISIT:  New Prescriptions   ALBUTEROL (PROVENTIL HFA;VENTOLIN HFA) 108 (90 BASE) MCG/ACT INHALER    Inhale 2 puffs into the lungs every 4 (four) hours as needed for wheezing or shortness of breath.   AZITHROMYCIN (ZITHROMAX Z-PAK) 250 MG TABLET    Take 2 tablets (500 mg) on  Day 1,  followed by 1 tablet (250 mg) once daily on Days 2 through 5.   CETIRIZINE (ZYRTEC) 10 MG TABLET    Take 1 tablet (10 mg total) by mouth daily.   FLUTICASONE (FLONASE) 50 MCG/ACT NASAL SPRAY    Place 1 spray into both nostrils 2 (two) times daily.   PREDNISONE (DELTASONE) 10 MG TABLET    Take 1 tablet (10 mg total) by mouth as directed.        Delorise Royals Ayerim Berquist, PA-C 11/13/15 1540  Jeanmarie Plant, MD 11/14/15 [phone]

## 2015-11-13 NOTE — ED Notes (Signed)
Pt reports cough and congestion x 1 week.

## 2015-11-13 NOTE — Discharge Instructions (Signed)

## 2015-12-17 ENCOUNTER — Encounter: Payer: Self-pay | Admitting: Emergency Medicine

## 2015-12-17 ENCOUNTER — Emergency Department
Admission: EM | Admit: 2015-12-17 | Discharge: 2015-12-17 | Disposition: A | Discharge status: Home / Self Care | Payer: BLUE CROSS/BLUE SHIELD | Attending: Student | Admitting: Student

## 2015-12-17 DIAGNOSIS — J0100 Acute maxillary sinusitis, unspecified: Secondary | ICD-10-CM | POA: Insufficient documentation

## 2015-12-17 DIAGNOSIS — G501 Atypical facial pain: Secondary | ICD-10-CM | POA: Diagnosis present

## 2015-12-17 DIAGNOSIS — Z791 Long term (current) use of non-steroidal anti-inflammatories (NSAID): Secondary | ICD-10-CM | POA: Insufficient documentation

## 2015-12-17 DIAGNOSIS — J309 Allergic rhinitis, unspecified: Secondary | ICD-10-CM | POA: Diagnosis not present

## 2015-12-17 DIAGNOSIS — K219 Gastro-esophageal reflux disease without esophagitis: Secondary | ICD-10-CM | POA: Diagnosis not present

## 2015-12-17 MED ORDER — CETIRIZINE HCL 10 MG PO TABS: 10.0000 mg | ORAL_TABLET | Freq: Every day | ORAL | Status: DC

## 2015-12-17 MED ORDER — AMOXICILLIN-POT CLAVULANATE 875-125 MG PO TABS: 1.0000 | ORAL_TABLET | Freq: Two times a day (BID) | ORAL | Status: DC

## 2015-12-17 MED ORDER — FLUTICASONE PROPIONATE 50 MCG/ACT NA SUSP: 1.0000 | Freq: Two times a day (BID) | NASAL | Status: DC

## 2015-12-17 NOTE — ED Notes (Addendum)
Patient ambulatory to triage with steady gait, without difficulty or distress noted; pt reports yellow sinus drainage with left sided sinus/facial pressure x 2 days; pt denies pain at present

## 2015-12-17 NOTE — ED Notes (Addendum)
Pt c/o RT sided facial pressure, denies any numbness or tingling. Speech is clear. Pt denies dental pain. Pt states its sinus pressure and has been having congestion xfew days.

## 2015-12-17 NOTE — Discharge Instructions (Signed)
Allergic Rhinitis Allergic rhinitis is when the mucous membranes in the nose respond to allergens. Allergens are particles in the air that cause your body to have an allergic reaction. This causes you to release allergic antibodies. Through a chain of events, these eventually cause you to release histamine into the blood stream. Although meant to protect the body, it is this release of histamine that causes your discomfort, such as frequent sneezing, congestion, and an itchy, runny nose.  CAUSES Seasonal allergic rhinitis (hay fever) is caused by pollen allergens that may come from grasses, trees, and weeds. Year-round allergic rhinitis (perennial allergic rhinitis) is caused by allergens such as house dust mites, pet dander, and mold spores. SYMPTOMS  Nasal stuffiness (congestion).  Itchy, runny nose with sneezing and tearing of the eyes. DIAGNOSIS Your health care provider can help you determine the allergen or allergens that trigger your symptoms. If you and your health care provider are unable to determine the allergen, skin or blood testing may be used. Your health care provider will diagnose your condition after taking your health history and performing a physical exam. Your health care provider may assess you for other related conditions, such as asthma, pink eye, or an ear infection. TREATMENT Allergic rhinitis does not have a cure, but it can be controlled by:  Medicines that block allergy symptoms. These may include allergy shots, nasal sprays, and oral antihistamines.  Avoiding the allergen. Hay fever may often be treated with antihistamines in pill or nasal spray forms. Antihistamines block the effects of histamine. There are over-the-counter medicines that may help with nasal congestion and swelling around the eyes. Check with your health care provider before taking or giving this medicine. If avoiding the allergen or the medicine prescribed do not work, there are many new medicines  your health care provider can prescribe. Stronger medicine may be used if initial measures are ineffective. Desensitizing injections can be used if medicine and avoidance does not work. Desensitization is when a patient is given ongoing shots until the body becomes less sensitive to the allergen. Make sure you follow up with your health care provider if problems continue. HOME CARE INSTRUCTIONS It is not possible to completely avoid allergens, but you can reduce your symptoms by taking steps to limit your exposure to them. It helps to know exactly what you are allergic to so that you can avoid your specific triggers. SEEK MEDICAL CARE IF:  You have a fever.  You develop a cough that does not stop easily (persistent).  You have shortness of breath.  You start wheezing.  Symptoms interfere with normal daily activities.   This information is not intended to replace advice given to you by your health care provider. Make sure you discuss any questions you have with your health care provider.   Document Released: 06/15/2001 Document Revised: 10/11/2014 Document Reviewed: 05/28/2013 Elsevier Interactive Patient Education 2016 [employer] Sinusitis, Adult Sinusitis is redness, soreness, and inflammation of the paranasal sinuses. Paranasal sinuses are air pockets within the bones of your face. They are located beneath your eyes, in the middle of your forehead, and above your eyes. In healthy paranasal sinuses, mucus is able to drain out, and air is able to circulate through them by way of your nose. However, when your paranasal sinuses are inflamed, mucus and air can become trapped. This can allow bacteria and other germs to grow and cause infection. Sinusitis can develop quickly and last only a short time (acute) or continue over a long   period (chronic). Sinusitis that lasts for more than 12 weeks is considered chronic. CAUSES Causes of sinusitis include:  Allergies.  Structural abnormalities,  such as displacement of the cartilage that separates your nostrils (deviated septum), which can decrease the air flow through your nose and sinuses and affect sinus drainage.  Functional abnormalities, such as when the small hairs (cilia) that line your sinuses and help remove mucus do not work properly or are not present. SIGNS AND SYMPTOMS Symptoms of acute and chronic sinusitis are the same. The primary symptoms are pain and pressure around the affected sinuses. Other symptoms include:  Upper toothache.  Earache.  Headache.  Bad breath.  Decreased sense of smell and taste.  A cough, which worsens when you are lying flat.  Fatigue.  Fever.  Thick drainage from your nose, which often is green and may contain pus (purulent).  Swelling and warmth over the affected sinuses. DIAGNOSIS Your health care provider will perform a physical exam. During your exam, your health care provider may perform any of the following to help determine if you have acute sinusitis or chronic sinusitis:  Look in your nose for signs of abnormal growths in your nostrils (nasal polyps).  Tap over the affected sinus to check for signs of infection.  View the inside of your sinuses using an imaging device that has a light attached (endoscope). If your health care provider suspects that you have chronic sinusitis, one or more of the following tests may be recommended:  Allergy tests.  Nasal culture. A sample of mucus is taken from your nose, sent to a lab, and screened for bacteria.  Nasal cytology. A sample of mucus is taken from your nose and examined by your health care provider to determine if your sinusitis is related to an allergy. TREATMENT Most cases of acute sinusitis are related to a viral infection and will resolve on their own within 10 days. Sometimes, medicines are prescribed to help relieve symptoms of both acute and chronic sinusitis. These may include pain medicines, decongestants, nasal  steroid sprays, or saline sprays. However, for sinusitis related to a bacterial infection, your health care provider will prescribe antibiotic medicines. These are medicines that will help kill the bacteria causing the infection. Rarely, sinusitis is caused by a fungal infection. In these cases, your health care provider will prescribe antifungal medicine. For some cases of chronic sinusitis, surgery is needed. Generally, these are cases in which sinusitis recurs more than 3 times per year, despite other treatments. HOME CARE INSTRUCTIONS  Drink plenty of water. Water helps thin the mucus so your sinuses can drain more easily.  Use a humidifier.  Inhale steam 3-4 times a day (for example, sit in the bathroom with the shower running).  Apply a warm, moist washcloth to your face 3-4 times a day, or as directed by your health care provider.  Use saline nasal sprays to help moisten and clean your sinuses.  Take medicines only as directed by your health care provider.  If you were prescribed either an antibiotic or antifungal medicine, finish it all even if you start to feel better. SEEK IMMEDIATE MEDICAL CARE IF:  You have increasing pain or severe headaches.  You have nausea, vomiting, or drowsiness.  You have swelling around your face.  You have vision problems.  You have a stiff neck.  You have difficulty breathing.   This information is not intended to replace advice given to you by your health care provider. Make sure you discuss   any questions you have with your health care provider.   Document Released: 09/20/2005 Document Revised: 10/11/2014 Document Reviewed: 10/05/2011 Elsevier Interactive Patient Education 2016 Elsevier Inc.  

## 2015-12-17 NOTE — ED Provider Notes (Signed)
[facility] Emergency Department Provider Note  ____________________________________________  Time seen: Approximately 8:04 PM  I have reviewed the triage vital signs and the nursing notes.   HISTORY  Chief Complaint Facial Pain    HPI Calvin DiegoJeffrey L Dawn Sr. is a 45 y.o. male who presents to emergency department complaining of right sided sinus pressure, nasal congestion, drainage. Patient states that symptoms have been ongoing times "a few days." Patient has a long history of allergic rhinitis. He is not taking any medications currently. Patient denies any headache, fevers or chills, visual acuity changes, difficulty breathing or swallowing, chest pain, shortness of breath, abdominal pain, nausea or vomiting.   Past Medical History  Diagnosis Date  . GERD (gastroesophageal reflux disease)   . IBS (irritable bowel syndrome)     There are no active problems to display for this patient.   Past Surgical History  Procedure Laterality Date  . Back surgery      Current Outpatient Rx  Name  Route  Sig  Dispense  Refill  . albuterol (PROVENTIL HFA;VENTOLIN HFA) 108 (90 Base) MCG/ACT inhaler   Inhalation   Inhale 2 puffs into the lungs every 4 (four) hours as needed for wheezing or shortness of breath.   1 Inhaler   0   . amoxicillin-clavulanate (AUGMENTIN) 875-125 MG tablet   Oral   Take 1 tablet by mouth 2 (two) times daily.   14 tablet   0   . azithromycin (ZITHROMAX Z-PAK) 250 MG tablet      Take 2 tablets (500 mg) on  Day 1,  followed by 1 tablet (250 mg) once daily on Days 2 through 5.   6 each   0   . cetirizine (ZYRTEC) 10 MG tablet   Oral   Take 1 tablet (10 mg total) by mouth daily.   30 tablet   0   . fluticasone (FLONASE) 50 MCG/ACT nasal spray   Each Nare   Place 1 spray into both nostrils 2 (two) times daily.   16 g   0   . Magnesium Hydroxide (MILK OF MAGNESIA PO)   Oral   Take by mouth every other day.         .  naproxen (EC NAPROSYN) 500 MG EC tablet   Oral   Take 1 tablet (500 mg total) by mouth 2 (two) times daily with a meal.   30 tablet   0   . omeprazole (PRILOSEC) 20 MG capsule   Oral   Take 20 mg by mouth daily.         . predniSONE (DELTASONE) 10 MG tablet   Oral   Take 1 tablet (10 mg total) by mouth as directed.   21 tablet   0     Take on a daily basis of 6, 5, 4, 3, 2, 1     Allergies Review of patient's allergies indicates no known allergies.  Family History  Problem Relation Age of Onset  . Heart failure Mother   . Hypertension Mother   . Heart failure Father     Social History Social History  Substance Use Topics  . Smoking status: Never Smoker   . Smokeless tobacco: None  . Alcohol Use: No     Review of Systems  Constitutional: No fever/chills Eyes: No visual changes. No discharge ENT: No sore throat. Positive for nasal congestion. Positive for sinus pressure. Cardiovascular: no chest pain. Respiratory: no cough. No SOB. Skin: Negative for rash. Neurological: Negative  for headaches, focal weakness or numbness. 10-point ROS otherwise negative.  ____________________________________________   PHYSICAL EXAM:  VITAL SIGNS: ED Triage Vitals  Enc Vitals Group     BP 12/17/15 1946 143/100 mmHg     Pulse Rate 12/17/15 1946 66     Resp 12/17/15 1946 18     Temp 12/17/15 1946 97.6 F (36.4 C)     Temp Source 12/17/15 1946 Oral     SpO2 12/17/15 1946 95 %     Weight 12/17/15 1946 161 lb (73.029 kg)     Height 12/17/15 1946  (1.702 m)     Head Cir --      Peak Flow --      Pain Score 12/17/15 2000 10     Pain Loc --      Pain Edu? --      Excl. in GC? --      Constitutional: Alert and oriented. Well appearing and in no acute distress. Eyes: Conjunctivae are normal. PERRL. EOMI. Head: Atraumatic. ENT:      Ears: EACs and TMs are unremarkable bilaterally.      Nose: Moderate purulent congestion/rhinnorhea. Turbinates are erythematous in  nature. Patient is tender to percussion over the maxillary sinuses.      Mouth/Throat: Mucous membranes are moist. Oropharynx is nonerythematous and nonedematous. No erythema or edema surrounding dentition. Neck: No stridor.   Hematological/Lymphatic/Immunilogical: No cervical lymphadenopathy. Cardiovascular: Normal rate, regular rhythm. Normal S1 and S2.  Good peripheral circulation. Respiratory: Normal respiratory effort without tachypnea or retractions. Lungs CTAB. Neurologic:  Normal speech and language. No gross focal neurologic deficits are appreciated.  Skin:  Skin is warm, dry and intact. No rash noted. Psychiatric: Mood and affect are normal. Speech and behavior are normal. Patient exhibits appropriate insight and judgement.   ____________________________________________   LABS (all labs ordered are listed, but only abnormal results are displayed)  Labs Reviewed - No data to display ____________________________________________  EKG   ____________________________________________  RADIOLOGY   No results found.  ____________________________________________    PROCEDURES  Procedure(s) performed:       Medications - No data to display   ____________________________________________   INITIAL IMPRESSION / ASSESSMENT AND PLAN / ED COURSE  Pertinent labs & imaging results that were available during my care of the patient were reviewed by me and considered in my medical decision making (see chart for details).  Patient's diagnosis is consistent with acute maxillary sinusitis. Patient has a known diagnosis of allergic rhinitis but does not take any daily allergy medications.. Patient will be discharged home with prescriptions for Augmentin, Zyrtec, Flonase. Patient is to follow up with primary care provider if symptoms persist past this treatment course. Patient is given ED precautions to return to the ED for any worsening or new  symptoms.     ____________________________________________  FINAL CLINICAL IMPRESSION(S) / ED DIAGNOSES  Final diagnoses:  Acute maxillary sinusitis, recurrence not specified  Allergic rhinitis, unspecified allergic rhinitis type      NEW MEDICATIONS STARTED DURING THIS VISIT:  New Prescriptions   AMOXICILLIN-CLAVULANATE (AUGMENTIN) 875-125 MG TABLET    Take 1 tablet by mouth 2 (two) times daily.   CETIRIZINE (ZYRTEC) 10 MG TABLET    Take 1 tablet (10 mg total) by mouth daily.   FLUTICASONE (FLONASE) 50 MCG/ACT NASAL SPRAY    Place 1 spray into both nostrils 2 (two) times daily.        This chart was dictated using voice recognition software/Dragon. Despite best efforts  to proofread, errors can occur which can change the meaning. Any change was purely unintentional.    Racheal Patches, PA-C 12/17/15 2016  Gayla Doss, MD 12/17/15 2101

## 2016-03-14 ENCOUNTER — Encounter: Payer: Self-pay | Admitting: Emergency Medicine

## 2016-03-14 ENCOUNTER — Emergency Department
Admission: EM | Admit: 2016-03-14 | Discharge: 2016-03-14 | Disposition: A | Discharge status: Home / Self Care | Payer: BLUE CROSS/BLUE SHIELD | Attending: Emergency Medicine | Admitting: Emergency Medicine

## 2016-03-14 DIAGNOSIS — Z79899 Other long term (current) drug therapy: Secondary | ICD-10-CM | POA: Diagnosis not present

## 2016-03-14 DIAGNOSIS — Z7951 Long term (current) use of inhaled steroids: Secondary | ICD-10-CM | POA: Insufficient documentation

## 2016-03-14 DIAGNOSIS — J0100 Acute maxillary sinusitis, unspecified: Secondary | ICD-10-CM | POA: Diagnosis not present

## 2016-03-14 DIAGNOSIS — R05 Cough: Secondary | ICD-10-CM | POA: Diagnosis present

## 2016-03-14 MED ORDER — AZITHROMYCIN 250 MG PO TABS: ORAL_TABLET | ORAL | Status: DC

## 2016-03-14 NOTE — ED Provider Notes (Signed)
[name]: [id number]     Arrival date & time 03/14/16  1930 History   First MD Initiated Contact with Patient 03/14/16 2021     Chief Complaint  Patient presents with  . Recurrent Sinusitis  . Cough  . Nasal Congestion     (Consider location/radiation/quality/duration/timing/severity/associated sxs/prior Treatment) HPI  [age] you male presents emergent partner for evaluation of sinus pain and pressure. Symptoms have been present for 4-5 days. He's had significant pain and pressure along the maxillary sinus. Patient denies any fevers. Pain is moderate to severe. He is tried over-the-counter medications in no improvement. Symptoms increase over the last 24 hours. He denies any chest pain shortness of breath or difficulty breathing.  Past Medical History  Diagnosis Date  . GERD (gastroesophageal reflux disease)   . IBS (irritable bowel syndrome)    Past Surgical History  Procedure Laterality Date  . Back surgery     Family History  Problem Relation Age of Onset  . Heart failure Mother   . Hypertension Mother   . Heart failure Father    Social History  Substance Use Topics  . Smoking status: Never Smoker   . Smokeless tobacco: None  . Alcohol Use: No    Review of Systems  Constitutional: Negative.  Negative for fever, chills, activity change and appetite change.  HENT: Positive for sinus pressure. Negative for congestion, ear pain, mouth sores, rhinorrhea, sore throat and trouble swallowing.   Eyes: Negative for photophobia, pain and discharge.  Respiratory: Negative for cough, chest tightness and shortness of breath.   Cardiovascular: Negative for chest pain and leg swelling.  Gastrointestinal: Negative for nausea, vomiting, abdominal pain, diarrhea and abdominal distention.  Genitourinary: Negative for dysuria and difficulty urinating.  Musculoskeletal: Negative for back pain, arthralgias and gait problem.  Skin: Negative for color change and rash.  Neurological: Negative for  dizziness and headaches.  Hematological: Negative for adenopathy.  Psychiatric/Behavioral: Negative for behavioral problems and agitation.      Allergies  Review of patient's allergies indicates no known allergies.  Home Medications   Prior to Admission medications   Medication Sig Start Date End Date Taking? Authorizing Provider  albuterol (PROVENTIL HFA;VENTOLIN HFA) 108 (90 Base) MCG/ACT inhaler Inhale 2 puffs into the lungs Yoder 4 (four) hours as needed for wheezing or shortness of breath. 11/13/15   Delorise RoyalsJonathan D Cuthriell, PA-C  amoxicillin-clavulanate (AUGMENTIN) 875-125 MG tablet Take 1 tablet by mouth 2 (two) times daily. 12/17/15   Delorise RoyalsJonathan D Cuthriell, PA-C  azithromycin (ZITHROMAX Z-PAK) 250 MG tablet Take 2 tablets (500 mg) on  Day 1,  followed by 1 tablet (250 mg) once daily on Days 2 through 5. 03/14/16   Calvin Slackhomas C Aoki Wedemeyer, PA-C  cetirizine (ZYRTEC) 10 MG tablet Take 1 tablet (10 mg total) by mouth daily. 12/17/15   Delorise RoyalsJonathan D Cuthriell, PA-C  fluticasone (FLONASE) 50 MCG/ACT nasal spray Place 1 spray into both nostrils 2 (two) times daily. 12/17/15   Delorise RoyalsJonathan D Cuthriell, PA-C  Magnesium Hydroxide (MILK OF MAGNESIA PO) Take by mouth Yoder other day.    Historical Provider, MD  naproxen (EC NAPROSYN) 500 MG EC tablet Take 1 tablet (500 mg total) by mouth 2 (two) times daily with a meal. 08/10/15   Calvin V Bacon Menshew, PA-C  omeprazole (PRILOSEC) 20 MG capsule Take 20 mg by mouth daily.    Historical Provider, MD  predniSONE (DELTASONE) 10 MG tablet Take 1 tablet (10 mg total) by mouth as directed. 11/13/15   Delorise RoyalsJonathan D Cuthriell,  PA-C   BP 112/94 mmHg  Pulse 103  Temp(Src) 98.8 F (37.1 C)  Resp 20  Ht  (1.702 m)  Wt 80.74 kg  BMI 27.87 kg/m2  SpO2 97% Physical Exam  Constitutional: He is oriented to person, place, and time. He appears well-developed and well-nourished.  HENT:  Head: Normocephalic and atraumatic.  Right Ear: External ear normal.  Left Ear: External ear  normal.  Nose: Nose normal.  Mouth/Throat: Oropharynx is clear and moist. No oropharyngeal exudate.  Positive maxillary sinus tenderness to palpation. No frontal sinus tenderness.  Eyes: Conjunctivae and EOM are normal. Pupils are equal, round, and reactive to light.  Neck: Normal range of motion. Neck supple.  Cardiovascular: Normal rate, regular rhythm, normal heart sounds and intact distal pulses.   Pulmonary/Chest: Effort normal and breath sounds normal. No respiratory distress. He has no wheezes. He has no rales. He exhibits no tenderness.  Abdominal: Soft. Bowel sounds are normal. He exhibits no distension. There is no tenderness.  Musculoskeletal: Normal range of motion. He exhibits no edema or tenderness.  Neurological: He is alert and oriented to person, place, and time.  Skin: Skin is warm and dry.  Psychiatric: He has a normal mood and affect. His behavior is normal. Judgment and thought content normal.    ED Course  Procedures (including critical care time) Labs Review Labs Reviewed - No data to display  Imaging Review No results found. I have personally reviewed and evaluated these images and lab results as part of my medical decision-making.   EKG Interpretation None      MDM   Final diagnoses:  Acute maxillary sinusitis, recurrence not specified    45 year old male with sinusitis. He is treated with azithromycin 5 day Z-Pak. Will take over-the-counter sinus and cold medicine. Return to the ER for any worsening symptoms or urgent changes in his health.  Calvin Slack, PA-C 03/14/16 2039  Calvin Every, MD 03/14/16 2107

## 2016-03-14 NOTE — ED Notes (Signed)
Patient reports that he has a sinus infection that started about 3 days ago. Patient states that he has had cough and congestion.

## 2016-03-14 NOTE — Discharge Instructions (Signed)
Sinus Rinse WHAT IS A SINUS RINSE? A sinus rinse is a simple home treatment that is used to rinse your sinuses with a sterile mixture of salt and water (saline solution). Sinuses are air-filled spaces in your skull behind the bones of your face and forehead that open into your nasal cavity. You will use the following:  Saline solution.  Neti pot or spray bottle. This releases the saline solution into your nose and through your sinuses. Neti pots and spray bottles can be purchased at your local pharmacy, a health food store, or online. WHEN WOULD I DO A SINUS RINSE? A sinus rinse can help to clear mucus, dirt, dust, or pollen from the nasal cavity. You may do a sinus rinse when you have a cold, a virus, nasal allergy symptoms, a sinus infection, or stuffiness in the nose or sinuses. If you are considering a sinus rinse:  Ask your child's health care provider before performing a sinus rinse on your child.  Do not do a sinus rinse if you have had ear or nasal surgery, ear infection, or blocked ears. HOW DO I DO A SINUS RINSE?  Wash your hands.  Disinfect your device according to the directions provided and then dry it.  Use the solution that comes with your device or one that is sold separately in stores. Follow the mixing directions on the package.  Fill your device with the amount of saline solution as directed by the device instructions.  Stand over a sink and tilt your head sideways over the sink.  Place the spout of the device in your upper nostril (the one closer to the ceiling).  Gently pour or squeeze the saline solution into the nasal cavity. The liquid should drain to the lower nostril if you are not overly congested.  Gently blow your nose. Blowing too hard may cause ear pain.  Repeat in the other nostril.  Clean and rinse your device with clean water and then air-dry it. ARE THERE RISKS OF A SINUS RINSE?  Sinus rinse is generally very safe and effective. However, there  are a few risks, which include:   A burning sensation in the sinuses. This may happen if you do not make the saline solution as directed. Make sure to follow all directions when making the saline solution.  Infection from contaminated water. This is rare, but possible.  Nasal irritation.   This information is not intended to replace advice given to you by your health care provider. Make sure you discuss any questions you have with your health care provider.   Document Released: 04/17/2014 Document Reviewed: 04/17/2014 Elsevier Interactive Patient Education 2016 [employer] Inc[employer]  Sinusitis, Adult Sinusitis is redness, soreness, and inflammation of the paranasal sinuses. Paranasal sinuses are air pockets within the bones of your face. They are located beneath your eyes, in the middle of your forehead, and above your eyes. In healthy paranasal sinuses, mucus is able to drain out, and air is able to circulate through them by way of your nose. However, when your paranasal sinuses are inflamed, mucus and air can become trapped. This can allow bacteria and other germs to grow and cause infection. Sinusitis can develop quickly and last only a short time (acute) or continue over a long period (chronic). Sinusitis that lasts for more than 12 weeks is considered chronic. CAUSES Causes of sinusitis include:  Allergies.  Structural abnormalities, such as displacement of the cartilage that separates your nostrils (deviated septum), which can decrease the air flow   through your nose and sinuses and affect sinus drainage.  Functional abnormalities, such as when the small hairs (cilia) that line your sinuses and help remove mucus do not work properly or are not present. SIGNS AND SYMPTOMS Symptoms of acute and chronic sinusitis are the same. The primary symptoms are pain and pressure around the affected sinuses. Other symptoms include:  Upper toothache.  Earache.  Headache.  Bad breath.  Decreased  sense of smell and taste.  A cough, which worsens when you are lying flat.  Fatigue.  Fever.  Thick drainage from your nose, which often is green and may contain pus (purulent).  Swelling and warmth over the affected sinuses. DIAGNOSIS Your health care provider will perform a physical exam. During your exam, your health care provider may perform any of the following to help determine if you have acute sinusitis or chronic sinusitis:  Look in your nose for signs of abnormal growths in your nostrils (nasal polyps).  Tap over the affected sinus to check for signs of infection.  View the inside of your sinuses using an imaging device that has a light attached (endoscope). If your health care provider suspects that you have chronic sinusitis, one or more of the following tests may be recommended:  Allergy tests.  Nasal culture. A sample of mucus is taken from your nose, sent to a lab, and screened for bacteria.  Nasal cytology. A sample of mucus is taken from your nose and examined by your health care provider to determine if your sinusitis is related to an allergy. TREATMENT Most cases of acute sinusitis are related to a viral infection and will resolve on their own within 10 days. Sometimes, medicines are prescribed to help relieve symptoms of both acute and chronic sinusitis. These may include pain medicines, decongestants, nasal steroid sprays, or saline sprays. However, for sinusitis related to a bacterial infection, your health care provider will prescribe antibiotic medicines. These are medicines that will help kill the bacteria causing the infection. Rarely, sinusitis is caused by a fungal infection. In these cases, your health care provider will prescribe antifungal medicine. For some cases of chronic sinusitis, surgery is needed. Generally, these are cases in which sinusitis recurs more than 3 times per year, despite other treatments. HOME CARE INSTRUCTIONS  Drink plenty of  water. Water helps thin the mucus so your sinuses can drain more easily.  Use a humidifier.  Inhale steam 3-4 times a day (for example, sit in the bathroom with the shower running).  Apply a warm, moist washcloth to your face 3-4 times a day, or as directed by your health care provider.  Use saline nasal sprays to help moisten and clean your sinuses.  Take medicines only as directed by your health care provider.  If you were prescribed either an antibiotic or antifungal medicine, finish it all even if you start to feel better. SEEK IMMEDIATE MEDICAL CARE IF:  You have increasing pain or severe headaches.  You have nausea, vomiting, or drowsiness.  You have swelling around your face.  You have vision problems.  You have a stiff neck.  You have difficulty breathing.   This information is not intended to replace advice given to you by your health care provider. Make sure you discuss any questions you have with your health care provider.   Document Released: 09/20/2005 Document Revised: 10/11/2014 Document Reviewed: 10/05/2011 Elsevier Interactive Patient Education 2016 Elsevier Inc.  

## 2016-04-02 ENCOUNTER — Emergency Department
Admission: EM | Admit: 2016-04-02 | Discharge: 2016-04-02 | Disposition: A | Discharge status: Home / Self Care | Payer: BLUE CROSS/BLUE SHIELD | Attending: Emergency Medicine | Admitting: Emergency Medicine

## 2016-04-02 ENCOUNTER — Encounter: Payer: Self-pay | Admitting: Emergency Medicine

## 2016-04-02 DIAGNOSIS — Z79899 Other long term (current) drug therapy: Secondary | ICD-10-CM | POA: Insufficient documentation

## 2016-04-02 DIAGNOSIS — E860 Dehydration: Secondary | ICD-10-CM

## 2016-04-02 DIAGNOSIS — Z7952 Long term (current) use of systemic steroids: Secondary | ICD-10-CM | POA: Diagnosis not present

## 2016-04-02 DIAGNOSIS — L559 Sunburn, unspecified: Secondary | ICD-10-CM | POA: Diagnosis not present

## 2016-04-02 DIAGNOSIS — R531 Weakness: Secondary | ICD-10-CM | POA: Diagnosis present

## 2016-04-02 DIAGNOSIS — Z7951 Long term (current) use of inhaled steroids: Secondary | ICD-10-CM | POA: Diagnosis not present

## 2016-04-02 DIAGNOSIS — Z792 Long term (current) use of antibiotics: Secondary | ICD-10-CM | POA: Insufficient documentation

## 2016-04-02 DIAGNOSIS — Z791 Long term (current) use of non-steroidal anti-inflammatories (NSAID): Secondary | ICD-10-CM | POA: Diagnosis not present

## 2016-04-02 LAB — URINALYSIS COMPLETE WITH MICROSCOPIC (ARMC ONLY)
BILIRUBIN URINE: NEGATIVE
Bacteria, UA: NONE SEEN
Glucose, UA: NEGATIVE mg/dL
HGB URINE DIPSTICK: NEGATIVE
KETONES UR: NEGATIVE mg/dL
LEUKOCYTES UA: NEGATIVE
NITRITE: NEGATIVE
PH: 7 (ref 5.0–8.0)
Protein, ur: NEGATIVE mg/dL
RBC / HPF: NONE SEEN RBC/hpf (ref 0–5)
SPECIFIC GRAVITY, URINE: 1.021 (ref 1.005–1.030)
Squamous Epithelial / LPF: NONE SEEN

## 2016-04-02 LAB — BASIC METABOLIC PANEL
Anion gap: 10 (ref 5–15)
BUN: 18 mg/dL (ref 6–20)
CALCIUM: 8.9 mg/dL (ref 8.9–10.3)
CO2: 23 mmol/L (ref 22–32)
CREATININE: 1.03 mg/dL (ref 0.61–1.24)
Chloride: 105 mmol/L (ref 101–111)
GFR calc Af Amer: >60 mL/min (ref >60)
GLUCOSE: 98 mg/dL (ref 65–99)
POTASSIUM: 3.9 mmol/L (ref 3.5–5.1)
SODIUM: 138 mmol/L (ref 135–145)

## 2016-04-02 LAB — CBC
HEMATOCRIT: 37 % — AB (ref 40.0–52.0)
Hemoglobin: 12.6 g/dL — ABNORMAL LOW (ref 13.0–18.0)
MCH: 28.5 pg (ref 26.0–34.0)
MCHC: 34.1 g/dL (ref 32.0–36.0)
MCV: 83.6 fL (ref 80.0–100.0)
PLATELETS: 253 10*3/uL (ref 150–440)
RBC: 4.43 MIL/uL (ref 4.40–5.90)
RDW: 13.6 % (ref 11.5–14.5)
WBC: 7.9 10*3/uL (ref 3.8–10.6)

## 2016-04-02 MED ORDER — SODIUM CHLORIDE 0.9 % IV BOLUS (SEPSIS)
1000.0000 mL | Freq: Once | INTRAVENOUS | Status: AC
  Administered 2016-04-02: 1000 mL via INTRAVENOUS

## 2016-04-02 NOTE — Discharge Instructions (Signed)
Dehydration, Adult Dehydration is a condition in which you do not have enough fluid or water in your body. It happens when you take in less fluid than you lose. Vital organs such as the kidneys, brain, and heart cannot function without a proper amount of fluids. Any loss of fluids from the body can cause dehydration.  Dehydration can range from mild to severe. This condition should be treated right away to help prevent it from becoming severe. CAUSES  This condition may be caused by:  Vomiting.  Diarrhea.  Excessive sweating, such as when exercising in hot or humid weather.  Not drinking enough fluid during strenuous exercise or during an illness.  Excessive urine output.  Fever.  Certain medicines. RISK FACTORS This condition is more likely to develop in:  People who are taking certain medicines that cause the body to lose excess fluid (diuretics).   People who have a chronic illness, such as diabetes, that may increase urination.  Older adults.   People who live at high altitudes.   People who participate in endurance sports.  SYMPTOMS  Mild Dehydration  Thirst.  Dry lips.  Slightly dry mouth.  Dry, warm skin. Moderate Dehydration  Very dry mouth.   Muscle cramps.   Dark urine and decreased urine production.   Decreased tear production.   Headache.   Light-headedness, especially when you stand up from a sitting position.  Severe Dehydration  Changes in skin.   Cold and clammy skin.   Skin does not spring back quickly when lightly pinched and released.   Changes in body fluids.   Extreme thirst.   No tears.   Not able to sweat when body temperature is high, such as in hot weather.   Minimal urine production.   Changes in vital signs.   Rapid, weak pulse (more than 100 beats per minute when you are sitting still).   Rapid breathing.   Low blood pressure.   Other changes.   Sunken eyes.   Cold hands and feet.    Confusion.  Lethargy and difficulty being awakened.  Fainting (syncope).   Short-term weight loss.   Unconsciousness. DIAGNOSIS  This condition may be diagnosed based on your symptoms. You may also have tests to determine how severe your dehydration is. These tests may include:   Urine tests.   Blood tests.  TREATMENT  Treatment for this condition depends on the severity. Mild or moderate dehydration can often be treated at home. Treatment should be started right away. Do not wait until dehydration becomes severe. Severe dehydration needs to be treated at the hospital. Treatment for Mild Dehydration  Drinking plenty of water to replace the fluid you have lost.   Replacing minerals in your blood (electrolytes) that you may have lost.  Treatment for Moderate Dehydration  Consuming oral rehydration solution (ORS). Treatment for Severe Dehydration  Receiving fluid through an IV tube.   Receiving electrolyte solution through a feeding tube that is passed through your nose and into your stomach (nasogastric tube or NG tube).  Correcting any abnormalities in electrolytes. HOME CARE INSTRUCTIONS   Drink enough fluid to keep your urine clear or pale yellow.   Drink water or fluid slowly by taking small sips. You can also try sucking on ice cubes.  Have food or beverages that contain electrolytes. Examples include bananas and sports drinks.  Take over-the-counter and prescription medicines only as told by your health care provider.   Prepare ORS according to the manufacturer's instructions. Take sips  of ORS every 5 minutes until your urine returns to normal.  If you have vomiting or diarrhea, continue to try to drink water, ORS, or both.   If you have diarrhea, avoid:   Beverages that contain caffeine.   Fruit juice.   Milk.   Carbonated soft drinks.  Do not take salt tablets. This can lead to the condition of having too much sodium in your body  (hypernatremia).  SEEK MEDICAL CARE IF:  You cannot eat or drink without vomiting.  You have had moderate diarrhea during a period of more than 24 hours.  You have a fever. SEEK IMMEDIATE MEDICAL CARE IF:   You have extreme thirst.  You have severe diarrhea.  You have not urinated in 6-8 hours, or you have urinated only a small amount of very dark urine.  You have shriveled skin.  You are dizzy, confused, or both.   This information is not intended to replace advice given to you by your health care provider. Make sure you discuss any questions you have with your health care provider.   Document Released: 09/20/2005 Document Revised: 06/11/2015 Document Reviewed: 02/05/2015 Elsevier Interactive Patient Education 2016 [employer] Inc.  Sunburn Sunburn is damage to the skin caused by overexposure to ultraviolet (UV) rays. People with light skin or a fair complexion may be more susceptible to sunburn. Repeated sun exposure causes early skin aging such as wrinkles and sun spots. It also increases the risk of skin cancer. CAUSES A sunburn is caused by getting too much UV radiation from the sun. SYMPTOMS  Red or pink skin.  Soreness and swelling.  Pain.  Blisters.  Peeling skin.  Headache, fever, and fatigue if sunburn covers a large area. TREATMENT  Your caregiver may tell you to take certain medicines to lessen inflammation.  Your caregiver may have you use hydrocortisone cream or spray to help with itching and inflammation.  Your caregiver may prescribe an antibiotic cream to use on blisters. HOME CARE INSTRUCTIONS   Avoid further exposure to the sun.  Cool baths and cool compresses may be helpful if used several times per day. Do not apply ice, since this may result in more damage to the skin.  Only take over-the-counter or prescription medicines for pain, discomfort, or fever as directed by your caregiver.  Use aloe or other over-the-counter sunburn creams or  gels on your skin. Do not apply these creams or gels on blisters.  Drink enough fluids to keep your urine clear or pale yellow.  Do not break blisters. If blisters break, your caregiver may recommend an antibiotic cream to apply to the affected area. PREVENTION   Try to avoid the sun between 10:00 a.m. and 4:00 p.m. when it is the strongest.  Apply sunscreen at least 30 minutes before exposure to the sun.  Always wear protective hats, clothing, and sunglasses with UV protection.  Avoid medicines, herbs, and foods that increase your sensitivity to sunlight.  Avoid tanning beds. SEEK IMMEDIATE MEDICAL CARE IF:   You have a fever.  Your pain is uncontrolled with medicine.  You start to vomit or have diarrhea.  You feel faint or develop a headache with confusion.  You develop severe blistering.  You have a pus-like (purulent) discharge coming from the blisters.  Your burn becomes more painful and swollen. MAKE SURE YOU:  Understand these instructions.  Will watch your condition.  Will get help right away if you are not doing well or get worse.   This information  is not intended to replace advice given to you by your health care provider. Make sure you discuss any questions you have with your health care provider.   Document Released: 06/30/2005 Document Revised: 01/15/2013 Document Reviewed: 03/24/2015 Elsevier Interactive Patient Education [employer] Inc[employer]

## 2016-04-02 NOTE — ED Notes (Signed)
Patient presents to the ED with fatigue and nausea after going fishing on Wednesday.  Patient reports sunburn with blisters on his back and abdomen.  Patient reports using aloe and alcohol on sun burn.  Patient reports having numbness in hands, arms, and face.  Patient states he spent 8 hours in full sunlight on Wednesday.  Patient denies vomiting and diarrhea.

## 2016-04-02 NOTE — ED Notes (Signed)
Pt reprots he has been feeling increasingly weak since Wednesday. Pt spend time in the sun this weekend and reports he has been feeling like he got "sun poisoning"

## 2016-04-02 NOTE — ED Provider Notes (Signed)
[facility] Emergency Department Provider Note  Time seen: 5:08 PM  I have reviewed the triage vital signs and the nursing notes.   HISTORY  Chief Complaint Weakness and Nausea    HPI Calvin DiegoJeffrey L Catterton Sr. is a 45 y.o. male with a past medical history of gastric reflux, IBS, who presents to the emergency department feeling weak and fatigued. According to the patient 2 days ago he was outside for 8 hours in the sun and suffered a bad sunburn to his shoulders and abdomen. He states since then he has been feeling very fatigued. He has been trying to drink fluids but feels like he might be dehydrated. Denies any pain, states his been using aloe vera which has been helping for the discomfort.      Past Medical History  Diagnosis Date  . GERD (gastroesophageal reflux disease)   . IBS (irritable bowel syndrome)     There are no active problems to display for this patient.   Past Surgical History  Procedure Laterality Date  . Back surgery      Current Outpatient Rx  Name  Route  Sig  Dispense  Refill  . albuterol (PROVENTIL HFA;VENTOLIN HFA) 108 (90 Base) MCG/ACT inhaler   Inhalation   Inhale 2 puffs into the lungs every 4 (four) hours as needed for wheezing or shortness of breath.   1 Inhaler   0   . amoxicillin-clavulanate (AUGMENTIN) 875-125 MG tablet   Oral   Take 1 tablet by mouth 2 (two) times daily.   14 tablet   0   . azithromycin (ZITHROMAX Z-PAK) 250 MG tablet      Take 2 tablets (500 mg) on  Day 1,  followed by 1 tablet (250 mg) once daily on Days 2 through 5.   6 each   0   . cetirizine (ZYRTEC) 10 MG tablet   Oral   Take 1 tablet (10 mg total) by mouth daily.   30 tablet   0   . fluticasone (FLONASE) 50 MCG/ACT nasal spray   Each Nare   Place 1 spray into both nostrils 2 (two) times daily.   16 g   0   . Magnesium Hydroxide (MILK OF MAGNESIA PO)   Oral   Take by mouth every other day.         . naproxen (EC NAPROSYN) 500  MG EC tablet   Oral   Take 1 tablet (500 mg total) by mouth 2 (two) times daily with a meal.   30 tablet   0   . omeprazole (PRILOSEC) 20 MG capsule   Oral   Take 20 mg by mouth daily.         . predniSONE (DELTASONE) 10 MG tablet   Oral   Take 1 tablet (10 mg total) by mouth as directed.   21 tablet   0     Take on a daily basis of 6, 5, 4, 3, 2, 1     Allergies Review of patient's allergies indicates no known allergies.  Family History  Problem Relation Age of Onset  . Heart failure Mother   . Hypertension Mother   . Heart failure Father     Social History Social History  Substance Use Topics  . Smoking status: Never Smoker   . Smokeless tobacco: None  . Alcohol Use: No    Review of Systems Constitutional: Negative for fever. Cardiovascular: Negative for chest pain. Respiratory: Negative for shortness of breath. Gastrointestinal:  Negative for abdominal pain Genitourinary: Negative for dysuria. Musculoskeletal: Negative for back pain. Skin: Erythema consistent with sunburn to the upper back/shoulders, abdomen. Neurological: Negative for headache 10-point ROS otherwise negative.  ____________________________________________   PHYSICAL EXAM:  VITAL SIGNS: ED Triage Vitals  Enc Vitals Group     BP 04/02/16 1621 118/67 mmHg     Pulse Rate 04/02/16 1621 83     Resp 04/02/16 1621 18     Temp 04/02/16 1621 98.1 F (36.7 C)     Temp Source 04/02/16 1621 Oral     SpO2 04/02/16 1621 97 %     Weight 04/02/16 1621 180 lb (81.647 kg)     Height 04/02/16 1621 5\' 8"  (1.727 m)     Head Cir --      Peak Flow --      Pain Score 04/02/16 1552 7     Pain Loc --      Pain Edu? --      Excl. in GC? --     Constitutional: Alert and oriented. Well appearing and in no distress. Eyes: Normal exam ENT   Head: Normocephalic and atraumatic.   Mouth/Throat: Mucous membranes are moist. Cardiovascular: Normal rate, regular rhythm. No murmur Respiratory: Normal  respiratory effort without tachypnea nor retractions. Breath sounds are clear  Gastrointestinal: Soft and nontender. No distention. Musculoskeletal: Nontender with normal range of motion in all extremities.  Neurologic:  Normal speech and language. No gross focal neurologic deficits Skin:  Skin is warm, dry and intact.  Moderate erythema across the upper back, bilateral shoulders and abdomen consistent with sunburn. Psychiatric: Mood and affect are normal.  ____________________________________________  EKG reviewed and interpreted by myself shows normal sinus rhythm at 82 bpm, narrow QRS, normal axis, normal intervals, no ST changes noted. Normal EKG.  INITIAL IMPRESSION / ASSESSMENT AND PLAN / ED COURSE  Pertinent labs & imaging results that were available during my care of the patient were reviewed by me and considered in my medical decision making (see chart for details).  Patient presents to the emergency department feeling fatigue and generalized weakness for the past 2 days since getting very sunburn. Patient's labs are within normal limits. Patient likely experiencing symptoms related to mild dehydration. We'll IV hydrate in the emergency department. I discussed increasing oral fluids at home, patient is agreeable.  ____________________________________________   FINAL CLINICAL IMPRESSION(S) / ED DIAGNOSES  Sunburn Dehydration   Minna AntisKevin Nahomi Hegner, MD 04/02/16 1757

## 2016-06-01 ENCOUNTER — Emergency Department
Admission: EM | Admit: 2016-06-01 | Discharge: 2016-06-01 | Disposition: A | Discharge status: Home / Self Care | Payer: BLUE CROSS/BLUE SHIELD | Attending: Emergency Medicine | Admitting: Emergency Medicine

## 2016-06-01 ENCOUNTER — Encounter: Payer: Self-pay | Admitting: Emergency Medicine

## 2016-06-01 DIAGNOSIS — K589 Irritable bowel syndrome without diarrhea: Secondary | ICD-10-CM | POA: Insufficient documentation

## 2016-06-01 DIAGNOSIS — Z792 Long term (current) use of antibiotics: Secondary | ICD-10-CM | POA: Insufficient documentation

## 2016-06-01 DIAGNOSIS — R109 Unspecified abdominal pain: Secondary | ICD-10-CM | POA: Diagnosis present

## 2016-06-01 LAB — COMPREHENSIVE METABOLIC PANEL
ALK PHOS: 79 U/L (ref 38–126)
ALT: 90 U/L — ABNORMAL HIGH (ref 17–63)
ANION GAP: 7 (ref 5–15)
AST: 49 U/L — ABNORMAL HIGH (ref 15–41)
Albumin: 4.5 g/dL (ref 3.5–5.0)
BILIRUBIN TOTAL: 0.4 mg/dL (ref 0.3–1.2)
BUN: 19 mg/dL (ref 6–20)
CALCIUM: 9.1 mg/dL (ref 8.9–10.3)
CO2: 27 mmol/L (ref 22–32)
Chloride: 105 mmol/L (ref 101–111)
Creatinine, Ser: 1.36 mg/dL — ABNORMAL HIGH (ref 0.61–1.24)
GFR calc non Af Amer: >60 mL/min (ref >60)
Glucose, Bld: 104 mg/dL — ABNORMAL HIGH (ref 65–99)
Potassium: 4.5 mmol/L (ref 3.5–5.1)
Sodium: 139 mmol/L (ref 135–145)
TOTAL PROTEIN: 7.8 g/dL (ref 6.5–8.1)

## 2016-06-01 LAB — URINALYSIS COMPLETE WITH MICROSCOPIC (ARMC ONLY)
Bacteria, UA: NONE SEEN
Bilirubin Urine: NEGATIVE
GLUCOSE, UA: NEGATIVE mg/dL
HGB URINE DIPSTICK: NEGATIVE
Ketones, ur: NEGATIVE mg/dL
LEUKOCYTES UA: NEGATIVE
NITRITE: NEGATIVE
PH: 6 (ref 5.0–8.0)
PROTEIN: NEGATIVE mg/dL
SPECIFIC GRAVITY, URINE: 1.02 (ref 1.005–1.030)
Squamous Epithelial / LPF: NONE SEEN

## 2016-06-01 LAB — CBC
HCT: 39.4 % — ABNORMAL LOW (ref 40.0–52.0)
HEMOGLOBIN: 13.8 g/dL (ref 13.0–18.0)
MCH: 29 pg (ref 26.0–34.0)
MCHC: 35 g/dL (ref 32.0–36.0)
MCV: 83 fL (ref 80.0–100.0)
Platelets: 243 10*3/uL (ref 150–440)
RBC: 4.75 MIL/uL (ref 4.40–5.90)
RDW: 13.8 % (ref 11.5–14.5)
WBC: 8.4 10*3/uL (ref 3.8–10.6)

## 2016-06-01 LAB — LIPASE, BLOOD: Lipase: 38 U/L (ref 11–51)

## 2016-06-01 MED ORDER — DICYCLOMINE HCL 20 MG PO TABS: 20.0000 mg | ORAL_TABLET | Freq: Three times a day (TID) | ORAL | 0 refills | Status: DC | PRN

## 2016-06-01 NOTE — ED Notes (Signed)
Pt states that he feels like there is pressure in abdomen like he needs to have a BM, but has not had a full BM today.  Pt reports taking several doses of milk of mag to help, but has never tried any other OTC laxatives.  Pt has been seeing PCP for same issue, but was concerned today due to cramping.  Pt states that he is passing gas regularly and had a very small BM this morning.

## 2016-06-01 NOTE — ED Triage Notes (Signed)
Pt presents from home with lower abdominal pain at midline. Pt reports hx of ibs with constipation. He states that he takes milk of magnesia; last bm today. Pt alert & oriented with nad noted.

## 2016-06-01 NOTE — ED Provider Notes (Signed)
[facility] Emergency Department Provider Note   ____________________________________________   I have reviewed the triage vital signs and the nursing notes.   HISTORY  Chief Complaint Abdominal Pain   History limited by: Not Limited   HPI Calvin DiegoJeffrey L Milroy Sr. is a 45 y.o. male who presents to the emergency department today because of concerns for abdominal pain and if he is taking too much milk of magnesia. The patient states that he has a history of IBS and typically was only taking milk of magnesia every other day. He has however increases and acid prescriptions and was able to eat more. Because he is eating more he started taking milk of magnesia every day. He asked whether or not that is too much. He thinks that having that much milk of magnesia is also hurting his stomach on top of the IBS. Denies any fevers.   Past Medical History:  Diagnosis Date  . GERD (gastroesophageal reflux disease)   . IBS (irritable bowel syndrome)     There are no active problems to display for this patient.   Past Surgical History:  Procedure Laterality Date  . BACK SURGERY      Prior to Admission medications   Medication Sig Start Date End Date Taking? Authorizing Provider  albuterol (PROVENTIL HFA;VENTOLIN HFA) 108 (90 Base) MCG/ACT inhaler Inhale 2 puffs into the lungs every 4 (four) hours as needed for wheezing or shortness of breath. 11/13/15   Delorise RoyalsJonathan D Cuthriell, PA-C  amoxicillin-clavulanate (AUGMENTIN) 875-125 MG tablet Take 1 tablet by mouth 2 (two) times daily. 12/17/15   Delorise RoyalsJonathan D Cuthriell, PA-C  azithromycin (ZITHROMAX Z-PAK) 250 MG tablet Take 2 tablets (500 mg) on  Day 1,  followed by 1 tablet (250 mg) once daily on Days 2 through 5. 03/14/16   Evon Slackhomas C Gaines, PA-C  cetirizine (ZYRTEC) 10 MG tablet Take 1 tablet (10 mg total) by mouth daily. 12/17/15   Delorise RoyalsJonathan D Cuthriell, PA-C  fluticasone (FLONASE) 50 MCG/ACT nasal spray Place 1 spray into both nostrils  2 (two) times daily. 12/17/15   Delorise RoyalsJonathan D Cuthriell, PA-C  Magnesium Hydroxide (MILK OF MAGNESIA PO) Take by mouth every other day.    Historical Provider, MD  naproxen (EC NAPROSYN) 500 MG EC tablet Take 1 tablet (500 mg total) by mouth 2 (two) times daily with a meal. 08/10/15   Jenise V Bacon Menshew, PA-C  omeprazole (PRILOSEC) 20 MG capsule Take 20 mg by mouth daily.    Historical Provider, MD  predniSONE (DELTASONE) 10 MG tablet Take 1 tablet (10 mg total) by mouth as directed. 11/13/15   Delorise RoyalsJonathan D Cuthriell, PA-C    Allergies Review of patient's allergies indicates no known allergies.  Family History  Problem Relation Age of Onset  . Heart failure Mother   . Hypertension Mother   . Heart failure Father     Social History Social History  Substance Use Topics  . Smoking status: Never Smoker  . Smokeless tobacco: Never Used  . Alcohol use No    Review of Systems  Constitutional: Negative for fever. Cardiovascular: Negative for chest pain. Respiratory: Negative for shortness of breath. Gastrointestinal: Positive for abdominal pain Neurological: Negative for headaches, focal weakness or numbness.  10-point ROS otherwise negative.  ____________________________________________   PHYSICAL EXAM:  VITAL SIGNS: ED Triage Vitals  Enc Vitals Group     BP 06/01/16 1210 129/78     Pulse Rate 06/01/16 1210 92     Resp 06/01/16 1210 15  Temp 06/01/16 1210 98.1 F (36.7 C)     Temp src --      SpO2 06/01/16 1210 100 %     Weight 06/01/16 1210 172 lb 9 oz (78.3 kg)     Height 06/01/16 1210 5\' 7"  (1.702 m)     Head Circumference --      Peak Flow --      Pain Score 06/01/16 1220 1   Constitutional: Alert and oriented. Well appearing and in no distress. Eyes: Conjunctivae are normal. Normal extraocular movements. ENT   Head: Normocephalic and atraumatic.   Nose: No congestion/rhinnorhea.   Mouth/Throat: Mucous membranes are moist.   Neck: No  stridor. Hematological/Lymphatic/Immunilogical: No cervical lymphadenopathy. Cardiovascular: Normal rate, regular rhythm.  No murmurs, rubs, or gallops. Respiratory: Normal respiratory effort without tachypnea nor retractions. Breath sounds are clear and equal bilaterally. No wheezes/rales/rhonchi. Gastrointestinal: Soft and nontender. No distention.  Genitourinary: Deferred Musculoskeletal: Normal range of motion in all extremities.  Neurologic:  Normal speech and language. No gross focal neurologic deficits are appreciated.  Skin:  Skin is warm, dry and intact. No rash noted. Psychiatric: Mood and affect are normal. Speech and behavior are normal. Patient exhibits appropriate insight and judgment.  ____________________________________________    LABS (pertinent positives/negatives)  Labs Reviewed  COMPREHENSIVE METABOLIC PANEL - Abnormal; Notable for the following:       Result Value   Glucose, Bld 104 (*)    Creatinine, Ser 1.36 (*)    AST 49 (*)    ALT 90 (*)    All other components within normal limits  CBC - Abnormal; Notable for the following:    HCT 39.4 (*)    All other components within normal limits  LIPASE, BLOOD  URINALYSIS COMPLETEWITH MICROSCOPIC ([facility] ONLY)     ____________________________________________   EKG  None  ____________________________________________    RADIOLOGY  None  ____________________________________________   PROCEDURES  Procedures  ____________________________________________   INITIAL IMPRESSION / ASSESSMENT AND PLAN / ED COURSE  Pertinent labs & imaging results that were available during my care of the patient were reviewed by me and considered in my medical decision making (see chart for details).  Patient presents to the emergency department today with concerns for abdominal pain and whether or not he is taking too much milk of magnesia in the setting of IBS. On exam his abdomen is benign. Blood work without any  concerning symptoms. I did state that I thought it sounded like to much milk of magnesia. I did discuss alternative methods of bowel regimen. Will have patient follow-up with his GI doctor. ____________________________________________   FINAL CLINICAL IMPRESSION(S) / ED DIAGNOSES  Final diagnoses:  IBS (irritable bowel syndrome)     Note: This dictation was prepared with Dragon dictation. Any transcriptional errors that result from this process are unintentional    Phineas Semen, MD 06/01/16 1557

## 2016-06-01 NOTE — Discharge Instructions (Signed)
Please seek medical attention for any high fevers, chest pain, shortness of breath, change in behavior, persistent vomiting, bloody stool or any other new or concerning symptoms.  

## 2016-07-21 ENCOUNTER — Encounter: Payer: Self-pay | Admitting: Emergency Medicine

## 2016-07-21 ENCOUNTER — Emergency Department
Admission: EM | Admit: 2016-07-21 | Discharge: 2016-07-21 | Disposition: A | Discharge status: Home / Self Care | Payer: BLUE CROSS/BLUE SHIELD | Attending: Emergency Medicine | Admitting: Emergency Medicine

## 2016-07-21 DIAGNOSIS — J3089 Other allergic rhinitis: Secondary | ICD-10-CM

## 2016-07-21 DIAGNOSIS — R0981 Nasal congestion: Secondary | ICD-10-CM | POA: Diagnosis present

## 2016-07-21 DIAGNOSIS — Z791 Long term (current) use of non-steroidal anti-inflammatories (NSAID): Secondary | ICD-10-CM | POA: Diagnosis not present

## 2016-07-21 DIAGNOSIS — Z79899 Other long term (current) drug therapy: Secondary | ICD-10-CM | POA: Insufficient documentation

## 2016-07-21 MED ORDER — FLUTICASONE PROPIONATE 50 MCG/ACT NA SUSP: 1.0000 | Freq: Two times a day (BID) | NASAL | 0 refills | Status: DC

## 2016-07-21 MED ORDER — PREDNISONE 50 MG PO TABS: 50.0000 mg | ORAL_TABLET | Freq: Every day | ORAL | 0 refills | Status: DC

## 2016-07-21 MED ORDER — CETIRIZINE HCL 10 MG PO TABS: 10.0000 mg | ORAL_TABLET | Freq: Every day | ORAL | 0 refills | Status: DC

## 2016-07-21 NOTE — ED Triage Notes (Signed)
Pt states he was cleaning out his mother's house and removed a mattress  from the house. Pt states he and his wife started using it and since sleeping on it he has developed a cough and congestion. Pt thinks it is due to mold.

## 2016-07-21 NOTE — ED Notes (Signed)
Pt states understanding of discharge instructions. NAD noted at this time.  

## 2016-07-21 NOTE — ED Provider Notes (Signed)
[facility] Emergency Department Provider Note  ____________________________________________  Time seen: Approximately 3:25 PM  I have reviewed the triage vital signs and the nursing notes.   HISTORY  Chief Complaint No chief complaint on file.    HPI Calvin KOBLER Sr. is a 45 y.o. male who presents emergency department complaining of nasal congestion, scratchy throat, cough 34 days. Patient states that he recently obtained a mattress from his mother's house which has a known mold infestation. Patient states that he has been sleeping on a mattress for the past several days and developed the symptoms. Patient denies any fevers or chills, difficulty breathing or swallowing, productive cough, abdominal pain, nausea or vomiting. He has not taken any medications for this complaint prior to arrival.  Patient has been seen by myself and other providers in the emergency department for repeat allergic rhinitis, sinusitis, or bronchitis symptoms over the past year. Patient's symptoms are consistent with previous episodes. No changes from baseline. Patient does not take routine medications for his severe allergic rhinitis.   Past Medical History:  Diagnosis Date  . GERD (gastroesophageal reflux disease)   . IBS (irritable bowel syndrome)     There are no active problems to display for this patient.   Past Surgical History:  Procedure Laterality Date  . BACK SURGERY      Prior to Admission medications   Medication Sig Start Date End Date Taking? Authorizing Provider  albuterol (PROVENTIL HFA;VENTOLIN HFA) 108 (90 Base) MCG/ACT inhaler Inhale 2 puffs into the lungs every 4 (four) hours as needed for wheezing or shortness of breath. 11/13/15   Delorise Royals Toy Eisemann, PA-C  amoxicillin-clavulanate (AUGMENTIN) 875-125 MG tablet Take 1 tablet by mouth 2 (two) times daily. 12/17/15   Delorise Royals Lisia Westbay, PA-C  azithromycin (ZITHROMAX Z-PAK) 250 MG tablet Take 2 tablets  (500 mg) on  Day 1,  followed by 1 tablet (250 mg) once daily on Days 2 through 5. 03/14/16   Evon Slack, PA-C  cetirizine (ZYRTEC) 10 MG tablet Take 1 tablet (10 mg total) by mouth daily. 07/21/16   Delorise Royals Achaia Garlock, PA-C  dicyclomine (BENTYL) 20 MG tablet Take 1 tablet (20 mg total) by mouth 3 (three) times daily as needed for spasms. 06/01/16 06/01/17  Phineas Semen, MD  fluticasone (FLONASE) 50 MCG/ACT nasal spray Place 1 spray into both nostrils 2 (two) times daily. 07/21/16   Delorise Royals Nusayba Cadenas, PA-C  Magnesium Hydroxide (MILK OF MAGNESIA PO) Take by mouth every other day.    Historical Provider, MD  naproxen (EC NAPROSYN) 500 MG EC tablet Take 1 tablet (500 mg total) by mouth 2 (two) times daily with a meal. 08/10/15   Jenise V Bacon Menshew, PA-C  omeprazole (PRILOSEC) 20 MG capsule Take 20 mg by mouth daily.    Historical Provider, MD  predniSONE (DELTASONE) 50 MG tablet Take 1 tablet (50 mg total) by mouth daily with breakfast. 07/21/16   Delorise Royals Odella Appelhans, PA-C    Allergies Review of patient's allergies indicates no known allergies.  Family History  Problem Relation Age of Onset  . Heart failure Mother   . Hypertension Mother   . Heart failure Father     Social History Social History  Substance Use Topics  . Smoking status: Never Smoker  . Smokeless tobacco: Never Used  . Alcohol use No     Review of Systems  Constitutional: No fever/chills Eyes: No visual changes. No discharge ENT: Positive for nasal congestion and scratchy throat. Cardiovascular:  no chest pain. Respiratory: Positive cough. No SOB. Musculoskeletal: Negative for musculoskeletal pain. Skin: Negative for rash, abrasions, lacerations, ecchymosis. Neurological: Negative for headaches, focal weakness or numbness. 10-point ROS otherwise negative.  ____________________________________________   PHYSICAL EXAM:  VITAL SIGNS: ED Triage Vitals  Enc Vitals Group     BP      Pulse      Resp       Temp      Temp src      SpO2      Weight      Height      Head Circumference      Peak Flow      Pain Score      Pain Loc      Pain Edu?      Excl. in GC?      Constitutional: Alert and oriented. Well appearing and in no acute distress. Eyes: Conjunctivae are normal. PERRL. EOMI. Head: Atraumatic. ENT:      Ears: EACs and TMs are unremarkable bilaterally.      Nose: Moderate clear congestion/rhinnorhea. Turbinates are erythematous      Mouth/Throat: Mucous membranes are moist.  Oropharynx is mildly erythematous but nonedematous. Uvula is midline. Tonsils are unremarkable bilaterally. Neck: No stridor.   Hematological/Lymphatic/Immunilogical: No cervical lymphadenopathy. Cardiovascular: Normal rate, regular rhythm. Normal S1 and S2.  Good peripheral circulation. Respiratory: Normal respiratory effort without tachypnea or retractions. Lungs CTAB. Good air entry to the bases with no decreased or absent breath sounds. Musculoskeletal: Full range of motion to all extremities. No gross deformities appreciated. Neurologic:  Normal speech and language. No gross focal neurologic deficits are appreciated.  Skin:  Skin is warm, dry and intact. No rash noted. Psychiatric: Mood and affect are normal. Speech and behavior are normal. Patient exhibits appropriate insight and judgement.   ____________________________________________   LABS (all labs ordered are listed, but only abnormal results are displayed)  Labs Reviewed - No data to display ____________________________________________  EKG   ____________________________________________  RADIOLOGY   No results found.  ____________________________________________    PROCEDURES  Procedure(s) performed:    Procedures    Medications - No data to display   ____________________________________________   INITIAL IMPRESSION / ASSESSMENT AND PLAN / ED COURSE  Pertinent labs & imaging results that were available  during my care of the patient were reviewed by me and considered in my medical decision making (see chart for details).  Review of the [place] CSRS was performed in accordance of the [facility] prior to dispensing any controlled drugs.  Clinical Course    Patient's diagnosis is consistent with Increased allergic rhinitis due to fungal exposure. No indication for fungal infection. Patient has a dry nonproductive cough and lung sounds are reassuring. Patient's exam is consistent with allergic rhinitis. Patient does not take routine medications for this complaint and as such he'll be placed on Flonase, Zyrtec, per exam. Patient is offered albuterol inhaler for symptom control and he declines at this time due to cost.. Patient will follow-up with primary care as needed. Patient is given ED precautions to return to the ED for any worsening or new symptoms.     ____________________________________________  FINAL CLINICAL IMPRESSION(S) / ED DIAGNOSES  Final diagnoses:  Acute nonseasonal allergic rhinitis due to fungal spores      NEW MEDICATIONS STARTED DURING THIS VISIT:  Discharge Medication List as of 07/21/2016  4:17 PM          This chart was dictated using voice recognition software/Dragon.  Despite best efforts to proofread, errors can occur which can change the meaning. Any change was purely unintentional.    Racheal Patches, PA-C 07/21/16 1623    Minna Antis, MD 07/21/16 [phone]

## 2016-09-18 ENCOUNTER — Emergency Department: Payer: BLUE CROSS/BLUE SHIELD

## 2016-09-18 ENCOUNTER — Emergency Department
Admission: EM | Admit: 2016-09-18 | Discharge: 2016-09-18 | Disposition: A | Discharge status: Home / Self Care | Payer: BLUE CROSS/BLUE SHIELD | Attending: Emergency Medicine | Admitting: Emergency Medicine

## 2016-09-18 DIAGNOSIS — G8929 Other chronic pain: Secondary | ICD-10-CM | POA: Diagnosis not present

## 2016-09-18 DIAGNOSIS — R109 Unspecified abdominal pain: Secondary | ICD-10-CM | POA: Diagnosis present

## 2016-09-18 DIAGNOSIS — R1013 Epigastric pain: Secondary | ICD-10-CM | POA: Insufficient documentation

## 2016-09-18 DIAGNOSIS — Z79899 Other long term (current) drug therapy: Secondary | ICD-10-CM | POA: Insufficient documentation

## 2016-09-18 LAB — COMPREHENSIVE METABOLIC PANEL
ALT: 98 U/L — AB (ref 17–63)
AST: 64 U/L — AB (ref 15–41)
Albumin: 4.8 g/dL (ref 3.5–5.0)
Alkaline Phosphatase: 83 U/L (ref 38–126)
Anion gap: 9 (ref 5–15)
BUN: 15 mg/dL (ref 6–20)
CHLORIDE: 108 mmol/L (ref 101–111)
CO2: 20 mmol/L — AB (ref 22–32)
CREATININE: 0.94 mg/dL (ref 0.61–1.24)
Calcium: 8.8 mg/dL — ABNORMAL LOW (ref 8.9–10.3)
GFR calc non Af Amer: >60 mL/min (ref >60)
Glucose, Bld: 122 mg/dL — ABNORMAL HIGH (ref 65–99)
POTASSIUM: 4 mmol/L (ref 3.5–5.1)
SODIUM: 137 mmol/L (ref 135–145)
Total Bilirubin: 0.8 mg/dL (ref 0.3–1.2)
Total Protein: 8.3 g/dL — ABNORMAL HIGH (ref 6.5–8.1)

## 2016-09-18 LAB — CBC WITH DIFFERENTIAL/PLATELET
Basophils Absolute: 0 10*3/uL (ref 0–0.1)
Basophils Relative: 0 %
EOS ABS: 0 10*3/uL (ref 0–0.7)
Eosinophils Relative: 0 %
HEMATOCRIT: 41.1 % (ref 40.0–52.0)
HEMOGLOBIN: 14.3 g/dL (ref 13.0–18.0)
LYMPHS ABS: 0.4 10*3/uL — AB (ref 1.0–3.6)
LYMPHS PCT: 3 %
MCH: 28.5 pg (ref 26.0–34.0)
MCHC: 34.7 g/dL (ref 32.0–36.0)
MCV: 82.1 fL (ref 80.0–100.0)
MONOS PCT: 4 %
Monocytes Absolute: 0.5 10*3/uL (ref 0.2–1.0)
NEUTROS PCT: 93 %
Neutro Abs: 11.4 10*3/uL — ABNORMAL HIGH (ref 1.4–6.5)
Platelets: 261 10*3/uL (ref 150–440)
RBC: 5.01 MIL/uL (ref 4.40–5.90)
RDW: 13.7 % (ref 11.5–14.5)
WBC: 12.3 10*3/uL — ABNORMAL HIGH (ref 3.8–10.6)

## 2016-09-18 LAB — URINALYSIS, COMPLETE (UACMP) WITH MICROSCOPIC
BACTERIA UA: NONE SEEN
BILIRUBIN URINE: NEGATIVE
Glucose, UA: NEGATIVE mg/dL
Hgb urine dipstick: NEGATIVE
KETONES UR: NEGATIVE mg/dL
LEUKOCYTES UA: NEGATIVE
NITRITE: NEGATIVE
PROTEIN: NEGATIVE mg/dL
RBC / HPF: NONE SEEN RBC/hpf (ref 0–5)
SQUAMOUS EPITHELIAL / LPF: NONE SEEN
Specific Gravity, Urine: 1.019 (ref 1.005–1.030)
pH: 8 (ref 5.0–8.0)

## 2016-09-18 LAB — URINE DRUG SCREEN, QUALITATIVE (ARMC ONLY)
AMPHETAMINES, UR SCREEN: NOT DETECTED
BARBITURATES, UR SCREEN: NOT DETECTED
BENZODIAZEPINE, UR SCRN: NOT DETECTED
Cannabinoid 50 Ng, Ur ~~LOC~~: NOT DETECTED
Cocaine Metabolite,Ur ~~LOC~~: NOT DETECTED
MDMA (Ecstasy)Ur Screen: NOT DETECTED
METHADONE SCREEN, URINE: NOT DETECTED
Opiate, Ur Screen: NOT DETECTED
Phencyclidine (PCP) Ur S: NOT DETECTED
TRICYCLIC, UR SCREEN: NOT DETECTED

## 2016-09-18 LAB — TROPONIN I

## 2016-09-18 LAB — ETHANOL

## 2016-09-18 LAB — LIPASE, BLOOD: Lipase: 26 U/L (ref 11–51)

## 2016-09-18 MED ORDER — GI COCKTAIL ~~LOC~~
30.0000 mL | Freq: Once | ORAL | Status: DC
  Filled 2016-09-18: qty 30

## 2016-09-18 MED ORDER — FENTANYL CITRATE (PF) 100 MCG/2ML IJ SOLN
50.0000 ug | Freq: Once | INTRAMUSCULAR | Status: AC
  Administered 2016-09-18: 50 ug via INTRAVENOUS
  Filled 2016-09-18: qty 2

## 2016-09-18 MED ORDER — ONDANSETRON HCL 4 MG/2ML IJ SOLN
4.0000 mg | Freq: Once | INTRAMUSCULAR | Status: AC
  Administered 2016-09-18: 4 mg via INTRAVENOUS
  Filled 2016-09-18: qty 2

## 2016-09-18 MED ORDER — SODIUM CHLORIDE 0.9 % IV BOLUS (SEPSIS)
1000.0000 mL | Freq: Once | INTRAVENOUS | Status: AC
  Administered 2016-09-18: 1000 mL via INTRAVENOUS

## 2016-09-18 MED ORDER — FAMOTIDINE IN NACL 20-0.9 MG/50ML-% IV SOLN
20.0000 mg | Freq: Once | INTRAVENOUS | Status: AC
  Administered 2016-09-18: 20 mg via INTRAVENOUS
  Filled 2016-09-18: qty 50

## 2016-09-18 NOTE — ED Notes (Signed)
Patient transported to x-ray by rad tech 

## 2016-09-18 NOTE — ED Triage Notes (Signed)
Pt to ED by EMS with c/o of abdominal pain. Pt woke at 3:30 with sudden onset abdominal pain. Pt has hx of ulcers and GERD. Pt states he feels as if the pressure is from acid. Pt last ate at 4:30 yesterday when he had [employer] . Pt took milk of mag at 3:30 with no relief.

## 2016-09-18 NOTE — ED Notes (Signed)
Back returned from x-ray

## 2016-09-18 NOTE — ED Notes (Signed)
Patient given water for PO challenge.  

## 2016-09-18 NOTE — ED Notes (Signed)
Patient refused GI cocktail.   °

## 2016-09-18 NOTE — ED Provider Notes (Addendum)
[facility] Emergency Department Provider Note  ____________________________________________   I have reviewed the triage vital signs and the nursing notes.   HISTORY  Chief Complaint Abdominal Pain    HPI Calvin DiegoJeffrey L Naves Sr. is a 45 y.o. male who presents today complaining of "my stomach acid is acting up". Patient has a history of reflux disease. He states he usually doesn't eat food related night because it makes him feel bad but he had food-related night, and he had abdominal burning sensation in his epigastric region. Denies chest pain or shortness of breath. He is no longer having pain but he feels somewhat full. He is had some nausea. He states he's been trying to get himself to vomit Casodex and feel better. Patient has a history of irritable bowel syndrome, he does take laxatives for this, he has had chronic abdominal pain. This is a very similar pain to multiple prior. He has been placed on Bentyl and omeprazole and milk of magnesia as well as antacids. He does not recall was having taken antiacid. He states that this is about the same as it usually gets very concerned because he had food later in the day than normal and this may have caused him to have these symptoms. He is concerned about this. He denies any pleuritic component to his discomfort or melena bright red blood per rectum. He has a history of chronic constipation he's had no change in that. Recent normal bowel movement.  At this time he states he has no pain as he made himself vomit acid.    Past Medical History:  Diagnosis Date  . GERD (gastroesophageal reflux disease)   . IBS (irritable bowel syndrome)     There are no active problems to display for this patient.   Past Surgical History:  Procedure Laterality Date  . BACK SURGERY      Prior to Admission medications   Medication Sig Start Date End Date Taking? Authorizing Provider  cetirizine (ZYRTEC) 10 MG tablet Take 1 tablet (10  mg total) by mouth daily. 07/21/16  Yes Christiane HaJonathan D Cuthriell, PA-C  fluticasone (FLONASE) 50 MCG/ACT nasal spray Place 1 spray into both nostrils 2 (two) times daily. 07/21/16  Yes Jonathan D Cuthriell, PA-C  Magnesium Hydroxide (MILK OF MAGNESIA PO) Take by mouth every other day.   Yes Historical Provider, MD  omeprazole (PRILOSEC) 20 MG capsule Take 20 mg by mouth daily.   Yes Historical Provider, MD  dicyclomine (BENTYL) 20 MG tablet Take 1 tablet (20 mg total) by mouth 3 (three) times daily as needed for spasms. Patient not taking: Reported on 09/18/2016 06/01/16 06/01/17  Phineas SemenGraydon Goodman, MD  naproxen (EC NAPROSYN) 500 MG EC tablet Take 1 tablet (500 mg total) by mouth 2 (two) times daily with a meal. Patient not taking: Reported on 09/18/2016 08/10/15   Charlesetta IvoryJenise V Bacon Menshew, PA-C  predniSONE (DELTASONE) 50 MG tablet Take 1 tablet (50 mg total) by mouth daily with breakfast. Patient not taking: Reported on 09/18/2016 07/21/16   Delorise RoyalsJonathan D Cuthriell, PA-C    Allergies Patient has no known allergies.  Family History  Problem Relation Age of Onset  . Heart failure Mother   . Hypertension Mother   . Heart failure Father     Social History Social History  Substance Use Topics  . Smoking status: Never Smoker  . Smokeless tobacco: Never Used  . Alcohol use No    Review of Systems Constitutional: No fever/chills Eyes: No visual changes. ENT:  No sore throat. No stiff neck no neck pain Cardiovascular: Denies chest pain. Respiratory: Denies shortness of breath. Gastrointestinal:   See history of present illness Genitourinary: Negative for dysuria. Musculoskeletal: Negative lower extremity swelling Skin: Negative for rash. Neurological: Negative for severe headaches, focal weakness or numbness. 10-point ROS otherwise negative.  ____________________________________________   PHYSICAL EXAM:  VITAL SIGNS: ED Triage Vitals  Enc Vitals Group     BP 09/18/16 1008 (!) 145/87      Pulse Rate 09/18/16 1008 99     Resp 09/18/16 1008 17     Temp 09/18/16 1008 98.1 F (36.7 C)     Temp Source 09/18/16 1008 Oral     SpO2 09/18/16 1008 98 %     Weight --      Height --      Head Circumference --      Peak Flow --      Pain Score 09/18/16 1243 4     Pain Loc --      Pain Edu? --      Excl. in GC? --     Constitutional: Alert and oriented. Well appearing and in no acute distress. Eyes: Conjunctivae are normal. PERRL. EOMI. Head: Atraumatic. Nose: No congestion/rhinnorhea. Mouth/Throat: Mucous membranes are moist.  Oropharynx non-erythematous. Neck: No stridor.   Nontender with no meningismus Cardiovascular: Normal rate, regular rhythm. Grossly normal heart sounds.  Good peripheral circulation. Respiratory: Normal respiratory effort.  No retractions. Lungs CTAB. Abdominal: Soft and Slight epigastric discomfort which reproduces his pain. No distention. No guarding no rebound Back:  There is no focal tenderness or step off.  there is no midline tenderness there are no lesions noted. there is no CVA tenderness  Musculoskeletal: No lower extremity tenderness, no upper extremity tenderness. No joint effusions, no DVT signs strong distal pulses no edema Neurologic:  Normal speech and language. No gross focal neurologic deficits are appreciated.  Skin:  Skin is warm, dry and intact. No rash noted. Psychiatric: Mood and affect are normal. Speech and behavior are normal.  ____________________________________________   LABS (all labs ordered are listed, but only abnormal results are displayed)  Labs Reviewed  COMPREHENSIVE METABOLIC PANEL - Abnormal; Notable for the following:       Result Value   CO2 20 (*)    Glucose, Bld 122 (*)    Calcium 8.8 (*)    Total Protein 8.3 (*)    AST 64 (*)    ALT 98 (*)    All other components within normal limits  CBC WITH DIFFERENTIAL/PLATELET - Abnormal; Notable for the following:    WBC 12.3 (*)    Neutro Abs 11.4 (*)     Lymphs Abs 0.4 (*)    All other components within normal limits  URINALYSIS, COMPLETE (UACMP) WITH MICROSCOPIC - Abnormal; Notable for the following:    Color, Urine YELLOW (*)    APPearance CLEAR (*)    All other components within normal limits  ETHANOL  LIPASE, BLOOD  URINE DRUG SCREEN, QUALITATIVE ([facility] ONLY)  TROPONIN I   ____________________________________________  EKG  I personally interpreted any EKGs ordered by me or triage Normal sinus rhythm at 97 bpm no specific ST changes and no acute ST elevation or acute ST depression no ischemic changes normal axis ____________________________________________  RADIOLOGY  I reviewed any imaging ordered by me or triage that were performed during my shift and, if possible, patient and/or family made aware of any abnormal findings. ____________________________________________   PROCEDURES  Procedure(s)  performed: None  Procedures  Critical Care performed: None  ____________________________________________   INITIAL IMPRESSION / ASSESSMENT AND PLAN / ED COURSE  Pertinent labs & imaging results that were available during my care of the patient were reviewed by me and considered in my medical decision making (see chart for details).  Patient with chronic abdominal discomfort from his irritable bowel syndrome and acid reflux and some food related epigastric abdominal discomfort which is reproducible. He has chronic slight elevation in his liver function tests. He has no focal right upper quadrant tenderness to suggest that this is gallbladder disease. His bili is normal, and this has been consistent with his labs from last 2 years. He has negative ultrasounds for this exact discomfort with that same labs. Patient in no distress with no pain at this time. I think he likely has gallbladder disease. Very low suspicion for cholecystitis in any event. Low suspicion for intrathoracic pathology such as referred ACS PE or dissection  discomfort, nonetheless, we did do a troponin which is negative. Vital signs are reassuring patient is in no acute distress, he is tolerating by mouth here, this is a chronic recurrent issue for him. We'll have him follow back again with his GI doctor. Chest x-ray pending as a precaution.   ----------------------------------------- 2:48 PM on 09/18/2016 -----------------------------------------  Serial abdominal exams of this temperature no evidence of discomfort blood work and vitals and reassuring x-ray as well as EKG and troponin all reviewed. Patient eager to go home we will discharge him. Tolerate by mouth well. Return impression fall condition.At this time, there does not appear to be clinical evidence to support the diagnosis of pulmonary embolus, dissection, myocarditis, endocarditis, pericarditis, pericardial tamponade, acute coronary syndrome, pneumothorax, pneumonia, or any other acute intrathoracic pathology that will require admission or acute intervention. Nor is there evidence of any significant intra-abdominal pathology causing this discomfort. Clinical Course    ____________________________________________   FINAL CLINICAL IMPRESSION(S) / ED DIAGNOSES  Final diagnoses:  None      This chart was dictated using voice recognition software.  Despite best efforts to proofread,  errors can occur which can change meaning.      Jeanmarie Plant, MD 09/18/16 1446    Jeanmarie Plant, MD 09/18/16 [phone]

## 2016-10-29 ENCOUNTER — Emergency Department
Admission: EM | Admit: 2016-10-29 | Discharge: 2016-10-29 | Disposition: A | Discharge status: Home / Self Care | Payer: BLUE CROSS/BLUE SHIELD | Attending: Student in an Organized Health Care Education/Training Program | Admitting: Student in an Organized Health Care Education/Training Program

## 2016-10-29 DIAGNOSIS — R05 Cough: Secondary | ICD-10-CM | POA: Diagnosis present

## 2016-10-29 DIAGNOSIS — J09X2 Influenza due to identified novel influenza A virus with other respiratory manifestations: Secondary | ICD-10-CM | POA: Diagnosis not present

## 2016-10-29 DIAGNOSIS — J101 Influenza due to other identified influenza virus with other respiratory manifestations: Secondary | ICD-10-CM

## 2016-10-29 LAB — INFLUENZA PANEL BY PCR (TYPE A & B)
INFLBPCR: NEGATIVE
Influenza A By PCR: POSITIVE — AB

## 2016-10-29 MED ORDER — IBUPROFEN 600 MG PO TABS: 600.0000 mg | ORAL_TABLET | Freq: Four times a day (QID) | ORAL | 0 refills | Status: DC | PRN

## 2016-10-29 MED ORDER — PSEUDOEPH-BROMPHEN-DM 30-2-10 MG/5ML PO SYRP: 5.0000 mL | ORAL_SOLUTION | Freq: Four times a day (QID) | ORAL | 0 refills | Status: DC | PRN

## 2016-10-29 MED ORDER — KETOROLAC TROMETHAMINE 30 MG/ML IJ SOLN
30.0000 mg | Freq: Once | INTRAMUSCULAR | Status: AC
Start: 2016-10-29 — End: 2016-10-29
  Administered 2016-10-29: 30 mg via INTRAVENOUS
  Filled 2016-10-29: qty 1

## 2016-10-29 MED ORDER — SODIUM CHLORIDE 0.9 % IV BOLUS (SEPSIS)
1000.0000 mL | Freq: Once | INTRAVENOUS | Status: AC
  Administered 2016-10-29: 1000 mL via INTRAVENOUS

## 2016-10-29 MED ORDER — ONDANSETRON HCL 4 MG/2ML IJ SOLN
4.0000 mg | Freq: Once | INTRAMUSCULAR | Status: AC
  Administered 2016-10-29: 4 mg via INTRAVENOUS
  Filled 2016-10-29: qty 2

## 2016-10-29 MED ORDER — ONDANSETRON 8 MG PO TBDP: 8.0000 mg | ORAL_TABLET | Freq: Three times a day (TID) | ORAL | 0 refills | Status: DC | PRN

## 2016-10-29 NOTE — ED Triage Notes (Signed)
Pt states fever of 101 x 5 days, coughing, "not being able to eat/drink anything for 5 days". Pt states he has been taking Nyquil. States wife has been putting vapor rub and rubbing alcohol on chest to help with cough. Pt is alert, oriented, ambulatory.

## 2016-10-29 NOTE — ED Notes (Signed)
Pt drinking water in triage

## 2016-10-29 NOTE — ED Provider Notes (Signed)
[facility] Emergency Department Provider Note   ____________________________________________   First MD Initiated Contact with Patient 10/29/16 1356     (approximate)  I have reviewed the triage vital signs and the nursing notes.   HISTORY  Chief Complaint Fever and Cough    HPI Calvin Yoder. is a 46 y.o. male patient presents with 5 days of fever bodyaches cough and unable to tolerate food or fluids. Onset of complaint 5 days ago. Patient states some mild transient relief with NyQuil. Patient states does not take the flu shot and every year he catches the flu.    Past Medical History:  Diagnosis Date  . GERD (gastroesophageal reflux disease)   . IBS (irritable bowel syndrome)     There are no active problems to display for this patient.   Past Surgical History:  Procedure Laterality Date  . BACK SURGERY      Prior to Admission medications   Medication Sig Start Date End Date Taking? Authorizing Provider  brompheniramine-pseudoephedrine-DM 30-2-10 MG/5ML syrup Take 5 mLs by mouth 4 (four) times daily as needed. 10/29/16   Joni Reining, PA-C  cetirizine (ZYRTEC) 10 MG tablet Take 1 tablet (10 mg total) by mouth daily. 07/21/16   Delorise Royals Cuthriell, PA-C  dicyclomine (BENTYL) 20 MG tablet Take 1 tablet (20 mg total) by mouth 3 (three) times daily as needed for spasms. Patient not taking: Reported on 09/18/2016 06/01/16 06/01/17  Phineas Semen, MD  fluticasone [facility]) 50 MCG/ACT nasal spray Place 1 spray into both nostrils 2 (two) times daily. 07/21/16   Delorise Royals Cuthriell, PA-C  ibuprofen (ADVIL,MOTRIN) 600 MG tablet Take 1 tablet (600 mg total) by mouth every 6 (six) hours as needed. 10/29/16   Joni Reining, PA-C  Magnesium Hydroxide (MILK OF MAGNESIA PO) Take by mouth every other day.    Historical Provider, MD  naproxen (EC NAPROSYN) 500 MG EC tablet Take 1 tablet (500 mg total) by mouth 2 (two) times daily with a meal. Patient  not taking: Reported on 09/18/2016 08/10/15   Marisue Humble V Bacon Menshew, PA-C  omeprazole (PRILOSEC) 20 MG capsule Take 20 mg by mouth daily.    Historical Provider, MD  ondansetron (ZOFRAN ODT) 8 MG disintegrating tablet Take 1 tablet (8 mg total) by mouth every 8 (eight) hours as needed for nausea or vomiting. 10/29/16   Joni Reining, PA-C  predniSONE (DELTASONE) 50 MG tablet Take 1 tablet (50 mg total) by mouth daily with breakfast. Patient not taking: Reported on 09/18/2016 07/21/16   Delorise Royals Cuthriell, PA-C    Allergies Patient has no known allergies.  Family History  Problem Relation Age of Onset  . Heart failure Mother   . Hypertension Mother   . Heart failure Father     Social History Social History  Substance Use Topics  . Smoking status: Never Smoker  . Smokeless tobacco: Never Used  . Alcohol use No    Review of Systems Constitutional: No fever/chills Eyes: No visual changes. ENT: No sore throat. Cardiovascular: Denies chest pain. Respiratory: Denies shortness of breath. Gastrointestinal: No abdominal pain.  No nausea, no vomiting.  No diarrhea.  No constipation. Genitourinary: Negative for dysuria. Musculoskeletal: Negative for back pain. Skin: Negative for rash. Neurological: Positive for headaches but denies focal weakness or numbness.    ____________________________________________   PHYSICAL EXAM:  VITAL SIGNS: ED Triage Vitals  Enc Vitals Group     BP 10/29/16 1225 128/75  Pulse Rate 10/29/16 1225 (!) 113     Resp 10/29/16 1225 20     Temp 10/29/16 1225 98.6 F (37 C)     Temp Source 10/29/16 1225 Oral     SpO2 10/29/16 1225 100 %     Weight --      Height 10/29/16 1227 5\' 7"  (1.702 m)     Head Circumference --      Peak Flow --      Pain Score 10/29/16 1227 0     Pain Loc --      Pain Edu? --      Excl. in GC? --     Constitutional: Alert and oriented. Well appearing and in no acute distress. Eyes: Conjunctivae are normal. PERRL.  EOMI. Head: Atraumatic. Nose: Edematous nasal turbinates clear rhinorrhea Mouth/Throat: Mucous membranes are moist.  Oropharynx non-erythematous. Neck: No stridor.  No cervical spine tenderness to palpation. Hematological/Lymphatic/Immunilogical: No cervical lymphadenopathy. Cardiovascular: Tachycardic. Grossly normal heart sounds.  Good peripheral circulation. Respiratory: Normal respiratory effort.  No retractions. Lungs CTAB. Productive cough Gastrointestinal: Soft and nontender. No distention. No abdominal bruits. No CVA tenderness. Musculoskeletal: No lower extremity tenderness nor edema.  No joint effusions. Neurologic:  Normal speech and language. No gross focal neurologic deficits are appreciated. No gait instability. Skin:  Skin is warm, dry and intact. No rash noted. Psychiatric: Mood and affect are normal. Speech and behavior are normal.  ____________________________________________   LABS (all labs ordered are listed, but only abnormal results are displayed)  Labs Reviewed  INFLUENZA PANEL BY PCR (TYPE A & B) - Abnormal; Notable for the following:       Result Value   Influenza A By PCR POSITIVE (*)    All other components within normal limits   ____________________________________________  EKG   ____________________________________________  RADIOLOGY   ____________________________________________   PROCEDURES  Procedure(s) performed: None  Procedures  Critical Care performed: No  ____________________________________________   INITIAL IMPRESSION / ASSESSMENT AND PLAN / ED COURSE  Pertinent labs & imaging results that were available during my care of the patient were reviewed by me and considered in my medical decision making (see chart for details).  Patient's positive for influenza A. Patient given discharge care instructions. Patient given prescription for Zofran, from felt DM, and ibuprofen. Patient advised he's past the window for treatment with  Tamiflu. Patient given a work note. Patient advised to follow-up with family doctor.      ____________________________________________   FINAL CLINICAL IMPRESSION(S) / ED DIAGNOSES  Final diagnoses:  Influenza A      NEW MEDICATIONS STARTED DURING THIS VISIT:  New Prescriptions   BROMPHENIRAMINE-PSEUDOEPHEDRINE-DM 30-2-10 MG/5ML SYRUP    Take 5 mLs by mouth 4 (four) times daily as needed.   IBUPROFEN (ADVIL,MOTRIN) 600 MG TABLET    Take 1 tablet (600 mg total) by mouth every 6 (six) hours as needed.   ONDANSETRON (ZOFRAN ODT) 8 MG DISINTEGRATING TABLET    Take 1 tablet (8 mg total) by mouth every 8 (eight) hours as needed for nausea or vomiting.     Note:  This document was prepared using Dragon voice recognition software and may include unintentional dictation errors.    Joni Reiningonald K Demita Tobia, PA-C 10/29/16 1432    Willy EddyPatrick Robinson, MD 10/29/16 [phone]

## 2016-10-29 NOTE — ED Notes (Signed)
Pt reports having cough with productive clear phlegm which makes him gag and cough. Pt states he has been having a fever as well and has had a decreased appetite as well. Pt reports his throat has a scratchy feeling to it but denies any pain.  Pt frequently clearing throat.

## 2017-08-15 ENCOUNTER — Other Ambulatory Visit: Payer: Self-pay

## 2017-08-15 ENCOUNTER — Encounter: Payer: Self-pay | Admitting: Emergency Medicine

## 2017-08-15 ENCOUNTER — Emergency Department
Admission: EM | Admit: 2017-08-15 | Discharge: 2017-08-15 | Disposition: A | Discharge status: Home / Self Care | Payer: BLUE CROSS/BLUE SHIELD | Attending: Emergency Medicine | Admitting: Emergency Medicine

## 2017-08-15 DIAGNOSIS — Z5321 Procedure and treatment not carried out due to patient leaving prior to being seen by health care provider: Secondary | ICD-10-CM | POA: Insufficient documentation

## 2017-08-15 DIAGNOSIS — Z4800 Encounter for change or removal of nonsurgical wound dressing: Secondary | ICD-10-CM | POA: Insufficient documentation

## 2017-08-15 NOTE — ED Notes (Signed)
Called pt to room from pod d waiting , no response to name , other pts in the same area stated he walked out and has not returned

## 2017-08-15 NOTE — ED Triage Notes (Signed)
Patient to ER to have bandages on finger changed. Patient states he cut finger slicing [place] on slicing machine at work ([employer]) two days ago. Was seen at urgent care at that time and had stitches placed. States bandages are due to be changed today, but he didn't want to change them at home d/t amount of bleeding he had prior to stitches, and didn't want wife changing bandages d/t potential pain level involved. Denies any problems other than needing bandages changed.

## 2017-10-02 ENCOUNTER — Emergency Department: Payer: Self-pay

## 2017-10-02 ENCOUNTER — Emergency Department
Admission: EM | Admit: 2017-10-02 | Discharge: 2017-10-02 | Disposition: A | Discharge status: Home / Self Care | Payer: Self-pay | Attending: Student in an Organized Health Care Education/Training Program | Admitting: Student in an Organized Health Care Education/Training Program

## 2017-10-02 ENCOUNTER — Encounter: Payer: Self-pay | Admitting: Emergency Medicine

## 2017-10-02 ENCOUNTER — Other Ambulatory Visit: Payer: Self-pay

## 2017-10-02 DIAGNOSIS — J111 Influenza due to unidentified influenza virus with other respiratory manifestations: Secondary | ICD-10-CM

## 2017-10-02 DIAGNOSIS — R69 Illness, unspecified: Secondary | ICD-10-CM | POA: Insufficient documentation

## 2017-10-02 DIAGNOSIS — R0981 Nasal congestion: Secondary | ICD-10-CM | POA: Insufficient documentation

## 2017-10-02 DIAGNOSIS — J069 Acute upper respiratory infection, unspecified: Secondary | ICD-10-CM

## 2017-10-02 DIAGNOSIS — Z79899 Other long term (current) drug therapy: Secondary | ICD-10-CM | POA: Insufficient documentation

## 2017-10-02 DIAGNOSIS — R51 Headache: Secondary | ICD-10-CM | POA: Insufficient documentation

## 2017-10-02 DIAGNOSIS — R1110 Vomiting, unspecified: Secondary | ICD-10-CM | POA: Insufficient documentation

## 2017-10-02 DIAGNOSIS — R05 Cough: Secondary | ICD-10-CM | POA: Insufficient documentation

## 2017-10-02 LAB — INFLUENZA PANEL BY PCR (TYPE A & B)
INFLAPCR: NEGATIVE
Influenza B By PCR: NEGATIVE

## 2017-10-02 MED ORDER — ACETAMINOPHEN 500 MG PO TABS
1000.0000 mg | ORAL_TABLET | Freq: Once | ORAL | Status: AC
  Administered 2017-10-02: 1000 mg via ORAL
  Filled 2017-10-02: qty 2

## 2017-10-02 MED ORDER — IPRATROPIUM-ALBUTEROL 0.5-2.5 (3) MG/3ML IN SOLN
3.0000 mL | Freq: Once | RESPIRATORY_TRACT | Status: AC
  Administered 2017-10-02: 3 mL via RESPIRATORY_TRACT
  Filled 2017-10-02: qty 3

## 2017-10-02 MED ORDER — ALBUTEROL SULFATE HFA 108 (90 BASE) MCG/ACT IN AERS: 2.0000 | INHALATION_SPRAY | Freq: Four times a day (QID) | RESPIRATORY_TRACT | 2 refills | Status: DC | PRN

## 2017-10-02 MED ORDER — PREDNISONE 10 MG PO TABS: 10.0000 mg | ORAL_TABLET | Freq: Every day | ORAL | 0 refills | Status: DC

## 2017-10-02 MED ORDER — ONDANSETRON HCL 4 MG PO TABS: 4.0000 mg | ORAL_TABLET | Freq: Every day | ORAL | 0 refills | Status: DC | PRN

## 2017-10-02 MED ORDER — DEXAMETHASONE 4 MG PO TABS
10.0000 mg | ORAL_TABLET | Freq: Once | ORAL | Status: AC
  Administered 2017-10-02: 10 mg via ORAL
  Filled 2017-10-02: qty 2.5

## 2017-10-02 MED ORDER — PROMETHAZINE HCL 25 MG PO TABS
25.0000 mg | ORAL_TABLET | Freq: Once | ORAL | Status: AC
  Administered 2017-10-02: 25 mg via ORAL
  Filled 2017-10-02: qty 1

## 2017-10-02 NOTE — ED Provider Notes (Signed)
[facility] Emergency Department Provider Note    First MD Initiated Contact with Patient 10/02/17 [id number]     (approximate)  I have reviewed the triage vital signs and the nursing notes.   HISTORY  Chief Complaint URI    HPI Calvin DiegoJeffrey L Keyworth Sr. is a 46 y.o. male with a history of GERD as well as IBS with no recent hospitalizations presents with sore throat nonproductive cough nasal congestion and other flulike illness for the past 5 days.  States that he came in today because he started vomiting and is not been able to keep any fluid down.  States it also hurts to breathe.  Symptoms not improved with over-the-counter medications.  No recent antibiotic use.  This is also got a mild to moderate headache that is frontal.  No history of diabetes.  No trauma.  Past Medical History:  Diagnosis Date  . GERD (gastroesophageal reflux disease)   . IBS (irritable bowel syndrome)    Family History  Problem Relation Age of Onset  . Heart failure Mother   . Hypertension Mother   . Heart failure Father    Past Surgical History:  Procedure Laterality Date  . BACK SURGERY     There are no active problems to display for this patient.     Prior to Admission medications   Medication Sig Start Date End Date Taking? Authorizing Provider  brompheniramine-pseudoephedrine-DM 30-2-10 MG/5ML syrup Take 5 mLs by mouth 4 (four) times daily as needed. 10/29/16   Joni ReiningSmith, Ronald K, PA-C  cetirizine (ZYRTEC) 10 MG tablet Take 1 tablet (10 mg total) by mouth daily. 07/21/16   Cuthriell, Delorise RoyalsJonathan D, PA-C  dicyclomine (BENTYL) 20 MG tablet Take 1 tablet (20 mg total) by mouth 3 (three) times daily as needed for spasms. Patient not taking: Reported on 09/18/2016 06/01/16 06/01/17  Phineas SemenGoodman, Graydon, MD  fluticasone [facility](FLONASE) 50 MCG/ACT nasal spray Place 1 spray into both nostrils 2 (two) times daily. 07/21/16   Cuthriell, Delorise RoyalsJonathan D, PA-C  ibuprofen (ADVIL,MOTRIN) 600 MG tablet Take 1  tablet (600 mg total) by mouth every 6 (six) hours as needed. 10/29/16   Joni ReiningSmith, Ronald K, PA-C  Magnesium Hydroxide (MILK OF MAGNESIA PO) Take by mouth every other day.    [provider]  naproxen (EC NAPROSYN) 500 MG EC tablet Take 1 tablet (500 mg total) by mouth 2 (two) times daily with a meal. Patient not taking: Reported on 09/18/2016 08/10/15   Menshew, Charlesetta IvoryJenise V Bacon, PA-C  omeprazole (PRILOSEC) 20 MG capsule Take 20 mg by mouth daily.    [provider]  ondansetron (ZOFRAN ODT) 8 MG disintegrating tablet Take 1 tablet (8 mg total) by mouth every 8 (eight) hours as needed for nausea or vomiting. 10/29/16   Joni ReiningSmith, Ronald K, PA-C  predniSONE (DELTASONE) 50 MG tablet Take 1 tablet (50 mg total) by mouth daily with breakfast. Patient not taking: Reported on 09/18/2016 07/21/16   Cuthriell, Delorise RoyalsJonathan D, PA-C    Allergies Patient has no known allergies.    Social History Social History   Tobacco Use  . Smoking status: Never Smoker  . Smokeless tobacco: Never Used  Substance Use Topics  . Alcohol use: No  . Drug use: No    Review of Systems Patient denies headaches, rhinorrhea, blurry vision, numbness, shortness of breath, chest pain, edema, cough, abdominal pain, nausea, vomiting, diarrhea, dysuria, fevers, rashes or hallucinations unless otherwise stated above in HPI. ____________________________________________   PHYSICAL EXAM:  VITAL SIGNS:  Vitals:   10/02/17 0758  BP: (!) 124/101  Pulse: 93  Resp: 20  Temp: 99 F (37.2 C)  SpO2: 98%    Constitutional: Alert and oriented. in no acute distress. Eyes: Conjunctivae are normal.  Head: Atraumatic. Nose: +++ congestion/rhinnorhea. Mouth/Throat: Mucous membranes are moist.  Uvula midline, tonsillar erythema without exudates Neck: No stridor. Painless ROM.  Cardiovascular: Normal rate, regular rhythm. Grossly normal heart sounds.  Good peripheral circulation. Respiratory: Normal respiratory effort.  No  retractions. Lungs with occasional wheeze, no rhonchi Gastrointestinal: Soft and nontender. No distention. No abdominal bruits. No CVA tenderness. Genitourinary:  Musculoskeletal: No lower extremity tenderness nor edema.  No joint effusions. Neurologic:  Normal speech and language. No gross focal neurologic deficits are appreciated. No facial droop Skin:  Skin is warm, dry and intact. No rash noted. Psychiatric: Mood and affect are normal. Speech and behavior are normal.  ____________________________________________   LABS (all labs ordered are listed, but only abnormal results are displayed)  No results found for this or any previous visit (from the past 24 hour(s)). ____________________________________________  EKG ___________________________  RADIOLOGY  I personally reviewed all radiographic images ordered to evaluate for the above acute complaints and reviewed radiology reports and findings.  These findings were personally discussed with the patient.  Please see medical record for radiology report.  ____________________________________________   PROCEDURES  Procedure(s) performed:  Procedures    Critical Care performed: no ____________________________________________   INITIAL IMPRESSION / ASSESSMENT AND PLAN / ED COURSE  Pertinent labs & imaging results that were available during my care of the patient were reviewed by me and considered in my medical decision making (see chart for details).  DDX: ILI, flu, pna, bronchitis, gastritis  Calvin MartyrJeffrey L Seybold Sr. is a 46 y.o. who presents to the ED with flulike symptoms for the past several days.  Patient's flu is negative.  He is afebrile and hemodynamically stable with no hypoxia no evidence of respiratory distress.  Do suspect some component of bronchitis.  No evidence of lobar pneumonia or consolidation.  Patient is low risk by Wells criteria and is PERC negative.  There is no stridor and no evidence of PTA or RPA.  No  evidence of strep pharyngitis.  Most consistent with viral illness.  Given albuterol nebulizer and will be discharged with steroid burst albuterol inhaler as well as antiemetic.  Discussed strict return precautions.  Patient stable for follow-up with PCP.  Have discussed with the patient and available family all diagnostics and treatments performed thus far and all questions were answered to the best of my ability. The patient demonstrates understanding and agreement with plan.       ____________________________________________   FINAL CLINICAL IMPRESSION(S) / ED DIAGNOSES  Final diagnoses:  Upper respiratory tract infection, unspecified type  Influenza-like illness      NEW MEDICATIONS STARTED DURING THIS VISIT:  This SmartLink is deprecated. Use AVSMEDLIST instead to display the medication list for a patient.   Note:  This document was prepared using Dragon voice recognition software and may include unintentional dictation errors.    Willy Eddyobinson, Sebastian Lurz, MD 10/02/17 0930

## 2017-10-02 NOTE — ED Notes (Signed)
Patient transported to X-ray 

## 2017-10-02 NOTE — ED Triage Notes (Signed)
Pt arrived via EMS from home for reports of cough and sore throat for five days. EMS reports VSS.

## 2017-10-05 ENCOUNTER — Encounter: Payer: Self-pay | Admitting: Emergency Medicine

## 2017-10-05 ENCOUNTER — Emergency Department
Admission: EM | Admit: 2017-10-05 | Discharge: 2017-10-05 | Disposition: A | Discharge status: Home / Self Care | Payer: Self-pay | Attending: Emergency Medicine | Admitting: Emergency Medicine

## 2017-10-05 DIAGNOSIS — Z5321 Procedure and treatment not carried out due to patient leaving prior to being seen by health care provider: Secondary | ICD-10-CM | POA: Insufficient documentation

## 2017-10-05 DIAGNOSIS — R05 Cough: Secondary | ICD-10-CM | POA: Insufficient documentation

## 2017-10-05 NOTE — ED Triage Notes (Signed)
Pt comes into the ED via POV c/o cough that is nonproductive.  Patient denies any chest pain, shortness of breath or dizziness.  Patient in NAD at this time with even and unlabored respirations.  Denies any fevers at home.  Patient state the last time this happened he had to be placed on a z-pack and prednisone.

## 2017-12-02 IMAGING — CR DG ABDOMEN ACUTE W/ 1V CHEST
4 series · 4 of 4 positions shown · non-contrast
Comparison: 04/05/2014

CLINICAL DATA: Abdominal pain.

EXAM:
DG ABDOMEN ACUTE W/ 1V CHEST

[chest pa]
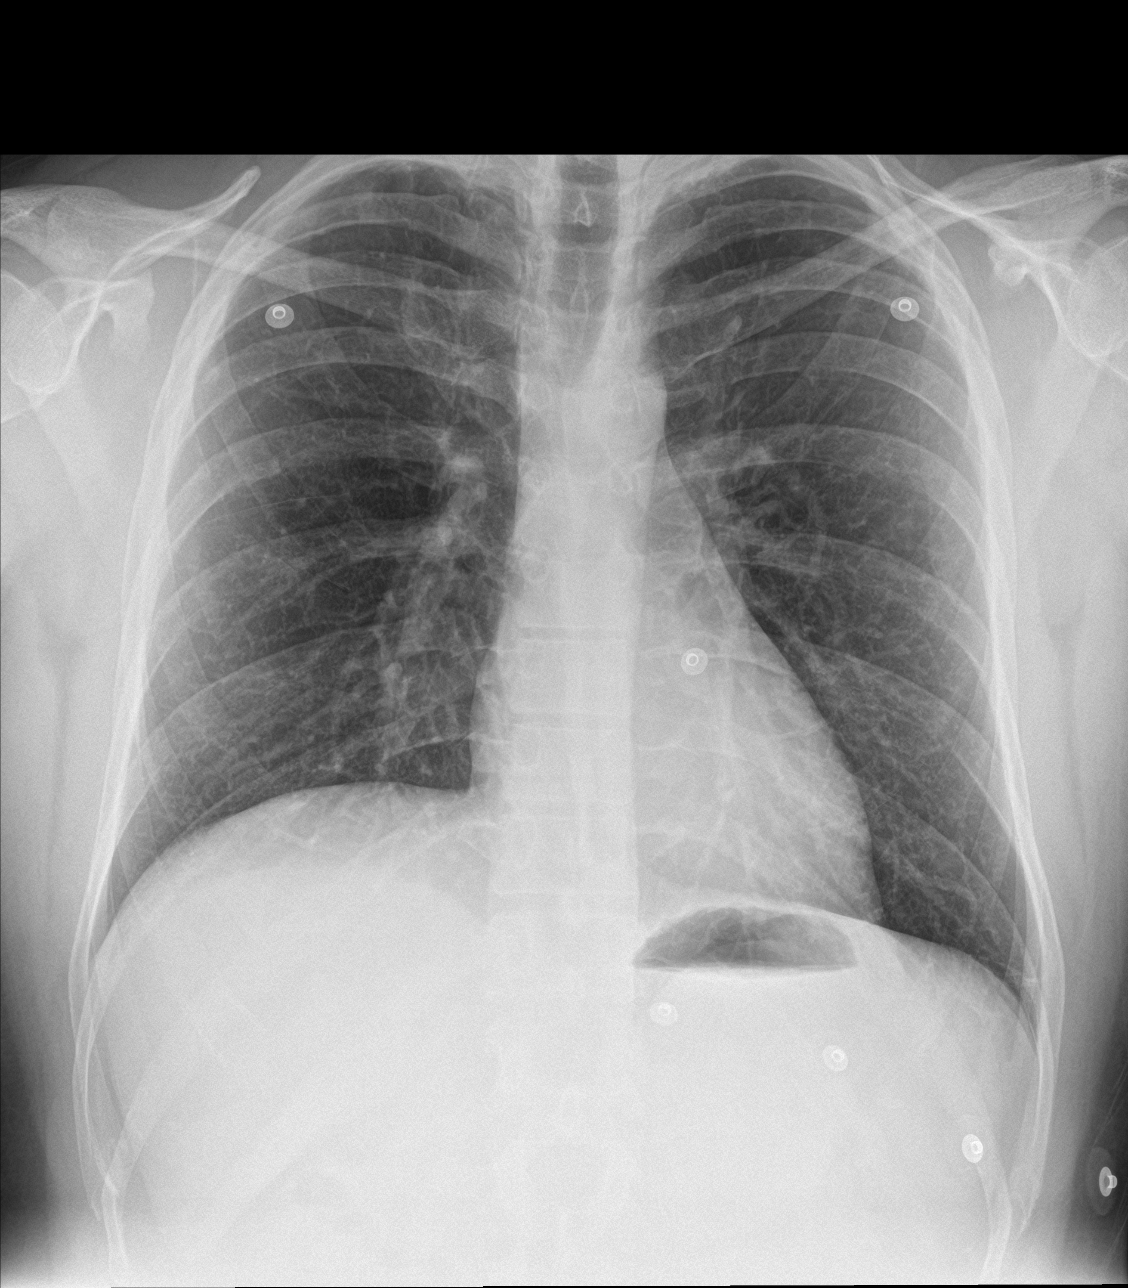

[abdomen erect]
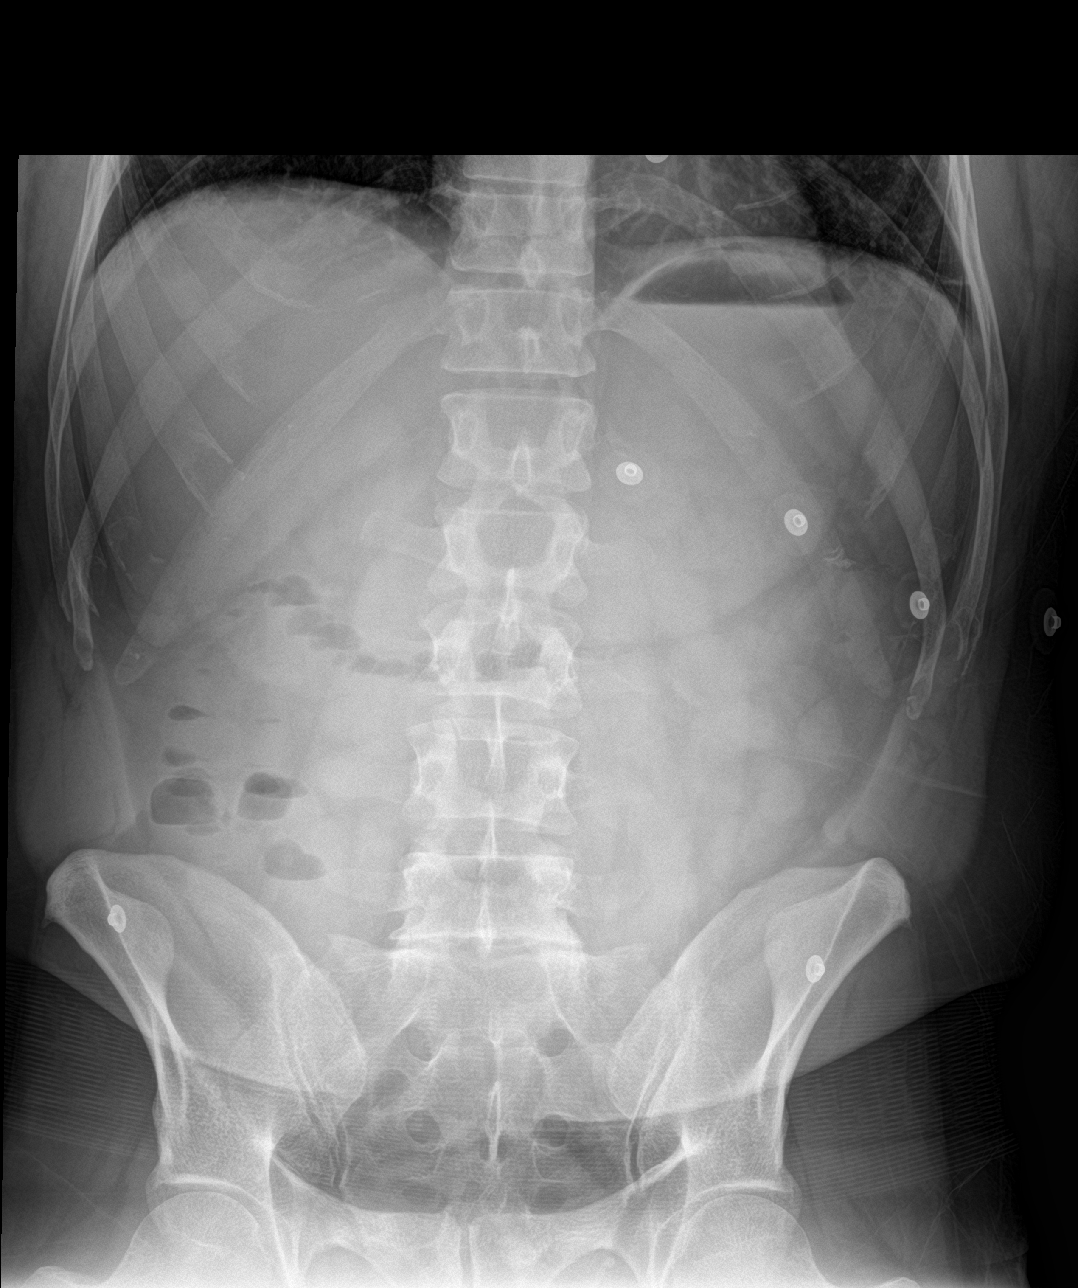

[abdomen supine (1 of 2)]
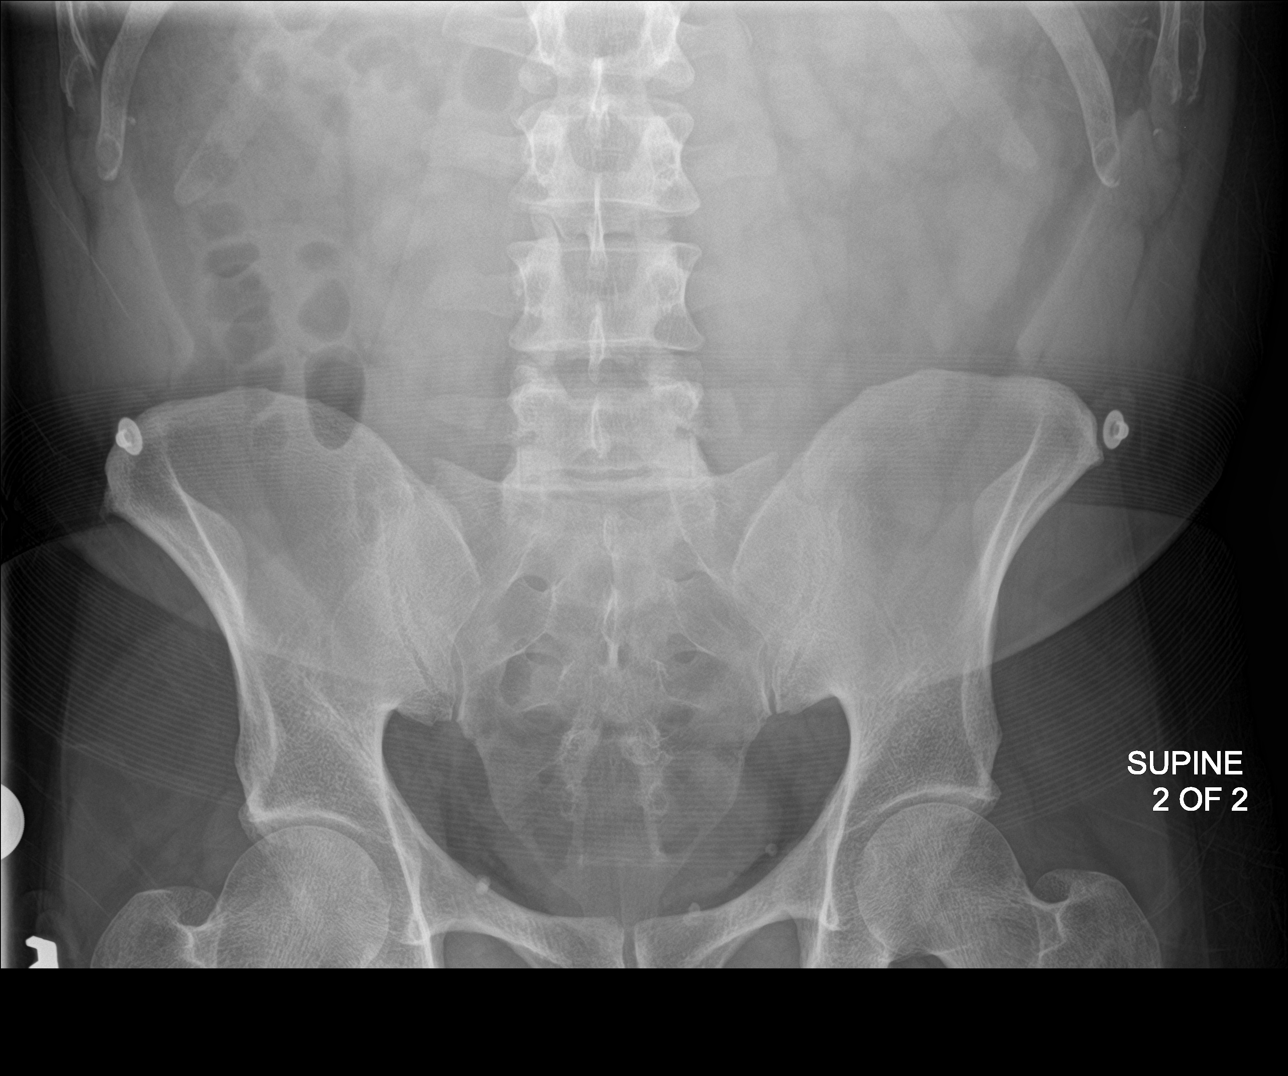

[abdomen supine (2 of 2)]
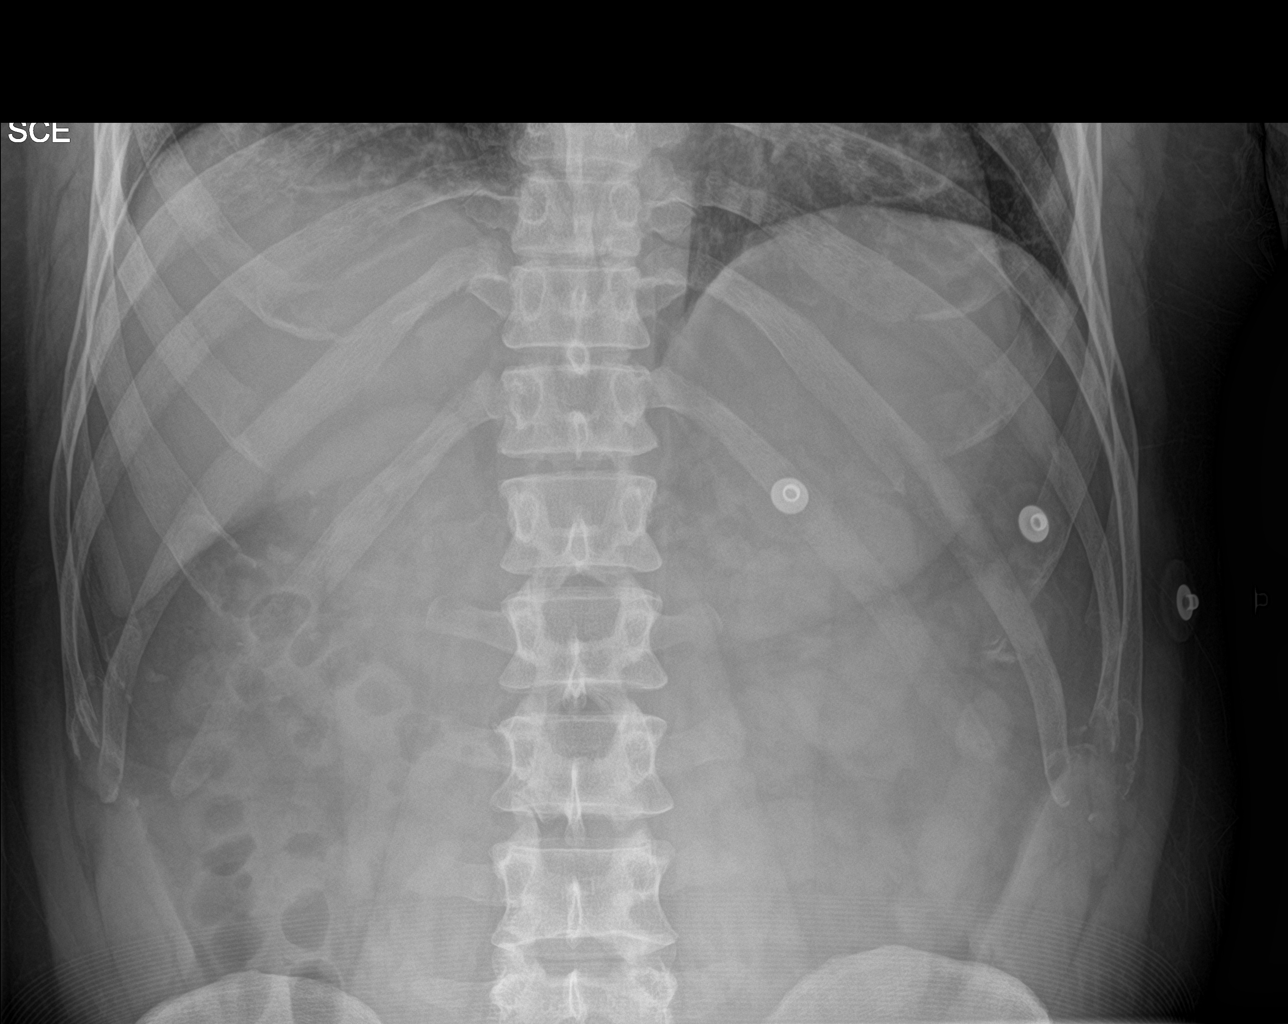

[4 of 4 positions shown; findings below may reference images not displayed]

FINDINGS: There is no evidence of dilated bowel loops or free intraperitoneal
air. No radiopaque calculi or other significant radiographic
abnormality is seen. Heart size and mediastinal contours are within
normal limits. Both lungs are clear.
IMPRESSION: Negative abdominal radiographs.  No acute cardiopulmonary disease.

## 2018-03-07 ENCOUNTER — Emergency Department
Admission: EM | Admit: 2018-03-07 | Discharge: 2018-03-07 | Disposition: A | Discharge status: Home / Self Care | Payer: Self-pay

## 2018-03-07 NOTE — ED Notes (Signed)
Pt called for triage with no answer 

## 2018-03-08 ENCOUNTER — Encounter: Payer: Self-pay | Admitting: Emergency Medicine

## 2018-03-08 ENCOUNTER — Emergency Department: Payer: Self-pay

## 2018-03-08 ENCOUNTER — Emergency Department
Admission: EM | Admit: 2018-03-08 | Discharge: 2018-03-08 | Disposition: A | Discharge status: Home / Self Care | Payer: Self-pay | Attending: Emergency Medicine | Admitting: Emergency Medicine

## 2018-03-08 ENCOUNTER — Other Ambulatory Visit: Payer: Self-pay

## 2018-03-08 DIAGNOSIS — K581 Irritable bowel syndrome with constipation: Secondary | ICD-10-CM | POA: Insufficient documentation

## 2018-03-08 DIAGNOSIS — Z79899 Other long term (current) drug therapy: Secondary | ICD-10-CM | POA: Insufficient documentation

## 2018-03-08 DIAGNOSIS — R140 Abdominal distension (gaseous): Secondary | ICD-10-CM | POA: Insufficient documentation

## 2018-03-08 LAB — URINALYSIS, COMPLETE (UACMP) WITH MICROSCOPIC
BACTERIA UA: NONE SEEN
BILIRUBIN URINE: NEGATIVE
Glucose, UA: NEGATIVE mg/dL
Hgb urine dipstick: NEGATIVE
Ketones, ur: NEGATIVE mg/dL
Leukocytes, UA: NEGATIVE
NITRITE: NEGATIVE
Protein, ur: NEGATIVE mg/dL
SPECIFIC GRAVITY, URINE: 1.021 (ref 1.005–1.030)
Squamous Epithelial / LPF: NONE SEEN (ref 0–5)
pH: 6 (ref 5.0–8.0)

## 2018-03-08 LAB — CBC
HEMATOCRIT: 38.7 % — AB (ref 40.0–52.0)
HEMOGLOBIN: 13.1 g/dL (ref 13.0–18.0)
MCH: 28.9 pg (ref 26.0–34.0)
MCHC: 34 g/dL (ref 32.0–36.0)
MCV: 85.1 fL (ref 80.0–100.0)
Platelets: 248 10*3/uL (ref 150–440)
RBC: 4.55 MIL/uL (ref 4.40–5.90)
RDW: 13.4 % (ref 11.5–14.5)
WBC: 6.9 10*3/uL (ref 3.8–10.6)

## 2018-03-08 LAB — COMPREHENSIVE METABOLIC PANEL
ALBUMIN: 4.4 g/dL (ref 3.5–5.0)
ALK PHOS: 53 U/L (ref 38–126)
ALT: 49 U/L (ref 17–63)
AST: 34 U/L (ref 15–41)
Anion gap: 8 (ref 5–15)
BILIRUBIN TOTAL: 0.6 mg/dL (ref 0.3–1.2)
BUN: 17 mg/dL (ref 6–20)
CALCIUM: 8.7 mg/dL — AB (ref 8.9–10.3)
CO2: 24 mmol/L (ref 22–32)
Chloride: 106 mmol/L (ref 101–111)
Creatinine, Ser: 1.1 mg/dL (ref 0.61–1.24)
GFR calc Af Amer: >60 mL/min (ref >60)
GFR calc non Af Amer: >60 mL/min (ref >60)
GLUCOSE: 92 mg/dL (ref 65–99)
Potassium: 4.2 mmol/L (ref 3.5–5.1)
Sodium: 138 mmol/L (ref 135–145)
TOTAL PROTEIN: 7.6 g/dL (ref 6.5–8.1)

## 2018-03-08 LAB — LIPASE, BLOOD: Lipase: 34 U/L (ref 11–51)

## 2018-03-08 MED ORDER — DICYCLOMINE HCL 20 MG PO TABS: 20.0000 mg | ORAL_TABLET | Freq: Three times a day (TID) | ORAL | 0 refills | Status: DC | PRN

## 2018-03-08 MED ORDER — DICYCLOMINE HCL 10 MG PO CAPS
20.0000 mg | ORAL_CAPSULE | Freq: Once | ORAL | Status: AC
Start: 2018-03-08 — End: 2018-03-08
  Administered 2018-03-08: 20 mg via ORAL
  Filled 2018-03-08: qty 2

## 2018-03-08 NOTE — ED Provider Notes (Signed)
[facility] Emergency Department Provider Note ____________________________________________  Time seen: 1605  I have reviewed the triage vital signs and the nursing notes.  HISTORY  Chief Complaint  Abdominal Pain  HPI Calvin Yoder. is a 47 y.o. male presents himself to the ED for evaluation of persistent, intermittent abdominal pain.  Patient describes symptoms for the last month.  He has a remote history of GERD currently treated with omeprazole, as well as IBS-C, without current medical management.  He reports lower abdominal gas pressure that he cannot seem to pass. He reports normal stool habits without impaction. He is denying any nausea, vomiting, or diarrhea symptoms.  Also denies any fevers, chills, sweats, or chest pain.  Patient is without any dysuria, hematuria, or medications yet.  He is just here to be sure there is no other cause for his constant abdominal pain.  Patient is uninsured and has not been seen by regular provider in 2 years.  Past Medical History:  Diagnosis Date  . GERD (gastroesophageal reflux disease)   . IBS (irritable bowel syndrome)     There are no active problems to display for this patient.   Past Surgical History:  Procedure Laterality Date  . BACK SURGERY      Prior to Admission medications   Medication Sig Start Date End Date Taking? Authorizing Provider  albuterol (PROVENTIL HFA;VENTOLIN HFA) 108 (90 Base) MCG/ACT inhaler Inhale 2 puffs into the lungs every 6 (six) hours as needed for wheezing or shortness of breath. 10/02/17   Willy Eddy, MD  cetirizine (ZYRTEC) 10 MG tablet Take 1 tablet (10 mg total) by mouth daily. Patient not taking: Reported on 10/02/2017 07/21/16   Cuthriell, Delorise Royals, PA-C  dicyclomine (BENTYL) 20 MG tablet Take 1 tablet (20 mg total) by mouth 3 (three) times daily as needed for up to 15 days for spasms. 03/08/18 03/23/18  Enyla Lisbon, Charlesetta Ivory, PA-C  fluticasone (FLONASE) 50  MCG/ACT nasal spray Place 1 spray into both nostrils 2 (two) times daily. Patient not taking: Reported on 10/02/2017 07/21/16   Cuthriell, Delorise Royals, PA-C  ibuprofen (ADVIL,MOTRIN) 600 MG tablet Take 1 tablet (600 mg total) by mouth every 6 (six) hours as needed. Patient not taking: Reported on 10/02/2017 10/29/16   Joni Reining, PA-C  Magnesium Hydroxide (MILK OF MAGNESIA PO) Take by mouth every other day.    [provider]  Omeprazole 20 MG TBEC Take 20 mg by mouth daily.    [provider]  ondansetron (ZOFRAN) 4 MG tablet Take 1 tablet (4 mg total) by mouth daily as needed for nausea or vomiting. 10/02/17 10/02/18  Willy Eddy, MD  predniSONE (DELTASONE) 10 MG tablet Take 1 tablet (10 mg total) by mouth daily. Day 1-2: Take 50 mg  ( 5 pills) Day 3-4 : Take 40 mg (4pills) Day 5-6: Take 30 mg (3 pills) Day 7-8:  Take 20 mg (2 pills) Day 9:  Take 10mg  (1 pill) 10/02/17   Willy Eddy, MD    Allergies Patient has no known allergies.  Family History  Problem Relation Age of Onset  . Heart failure Mother   . Hypertension Mother   . Heart failure Father     Social History Social History   Tobacco Use  . Smoking status: Never Smoker  . Smokeless tobacco: Never Used  Substance Use Topics  . Alcohol use: No  . Drug use: No    Review of Systems  Constitutional: Negative for fever. Cardiovascular:  Negative for chest pain. Respiratory: Negative for shortness of breath. Gastrointestinal: Positive for abdominal pain and bloating.  Denies nausea, vomiting and diarrhea.  Denies bright red blood on stools or dark tarry stools. Genitourinary: Negative for dysuria or retention. Musculoskeletal: Negative for back pain. Skin: Negative for rash. Neurological: Negative for headaches, focal weakness or numbness. ____________________________________________  PHYSICAL EXAM:  VITAL SIGNS: ED Triage Vitals  Enc Vitals Group     BP 03/08/18 1444 116/88      Pulse Rate 03/08/18 1444 98     Resp 03/08/18 1444 16     Temp --      Temp src --      SpO2 03/08/18 1444 98 %     Weight 03/08/18 1444 172 lb (78 kg)     Height 03/08/18 1444 5\' 7"  (1.702 m)     Head Circumference --      Peak Flow --      Pain Score 03/08/18 1443 5     Pain Loc --      Pain Edu? --      Excl. in GC? --     Constitutional: Alert and oriented. Well appearing and in no distress. Head: Normocephalic and atraumatic. Cardiovascular: Normal rate, regular rhythm. Normal distal pulses. NO murmur, rubs, or gallops.  Respiratory: Normal respiratory effort. No wheezes/rales/rhonchi. Gastrointestinal: Soft and nontender. No distention, rebound, guarding, or rigidity. Normal bowel sounds. Mildly tender to palpation over the lower abdomen.  Musculoskeletal: Nontender with normal range of motion in all extremities.  Neurologic:  Normal gait without ataxia. Normal speech and language. No gross focal neurologic deficits are appreciated. Skin:  Skin is warm, dry and intact. No rash noted. Psychiatric: Mood and affect are normal. Patient exhibits appropriate insight and judgment. ____________________________________________   LABS (pertinent positives/negatives)  Labs Reviewed  COMPREHENSIVE METABOLIC PANEL - Abnormal; Notable for the following components:      Result Value   Calcium 8.7 (*)    All other components within normal limits  CBC - Abnormal; Notable for the following components:   HCT 38.7 (*)    All other components within normal limits  URINALYSIS, COMPLETE (UACMP) WITH MICROSCOPIC - Abnormal; Notable for the following components:   Color, Urine YELLOW (*)    APPearance HAZY (*)    All other components within normal limits  LIPASE, BLOOD  ____________________________________________   RADIOLOGY  ABD 1 V  Negative ____________________________________________  PROCEDURES  Procedures Bentyl 20 mg  PO ____________________________________________  INITIAL IMPRESSION / ASSESSMENT AND PLAN / ED COURSE  Differential diagnosis includes, but is not limited to, acute appendicitis, renal colic, testicular torsion, urinary tract infection/pyelonephritis, prostatitis,  epididymitis, diverticulitis, small bowel obstruction or ileus, colitis, abdominal aortic aneurysm, gastroenteritis, hernia, etc.  Patient with ED evaluation of a 1 month complaint of chronic, intermittent abdominal pain.  Patient would describe pain as gas and bloating primarily.  He denies any nausea, vomiting, or diarrhea.  He does note the symptoms were consistent with foods he was eating including spicy hot wings.  He has been tolerating milk of magnesia for treatment in the interim.  Patient is reassured by his normal labs, exam, and upright abdomen.  No indication of any bowel obstruction or constipation.  Patient will be discharged with prescriptions for Bentyl to dose as directed.  [facility] as well as GI referral for ongoing management of his IBS-C. ____________________________________________  FINAL CLINICAL IMPRESSION(S) / ED DIAGNOSES  Final  diagnoses:  Irritable bowel syndrome with constipation  Bloating symptom      Karmen Stabs, Charlesetta Ivory, PA-C 03/08/18 1806    Phineas Semen, MD 03/08/18 2003

## 2018-03-08 NOTE — ED Triage Notes (Addendum)
Pt in via POV with complaints of constant abdominal pain x approximately month.  Pt with hx of IBS, states, I just want to make sure I dont have something else going on.  Pt denies N/V/D.  Vitals WDL. NAD noted at this time.

## 2018-03-08 NOTE — Discharge Instructions (Signed)
Your exam, labs, and x-ray are all essentially normal at this time. There is no indication of infection or obstructions. You are likely experiencing bloating and inflammation due to your IBS, and aggravated by the foods you are eating. You may take the prescription med as directed. Consider OTC Gas-X as needed. You [facility]. You may also see Dr. Maximino Greenlandahiliani for your gastrointestinal specialists. Return to the ED as needed.

## 2018-03-08 NOTE — ED Notes (Signed)
Pt unable to sign for discharge due to computer not work in room; pt expresses verbal understanding of discharge instructions with not further questions at this time.

## 2018-05-29 ENCOUNTER — Encounter: Payer: Self-pay | Admitting: Emergency Medicine

## 2018-05-29 ENCOUNTER — Emergency Department
Admission: EM | Admit: 2018-05-29 | Discharge: 2018-05-30 | Disposition: A | Discharge status: Home / Self Care | Payer: Self-pay | Attending: Emergency Medicine | Admitting: Emergency Medicine

## 2018-05-29 ENCOUNTER — Emergency Department
Admission: EM | Admit: 2018-05-29 | Discharge: 2018-05-29 | Disposition: A | Discharge status: Home / Self Care | Payer: Self-pay | Attending: Emergency Medicine | Admitting: Emergency Medicine

## 2018-05-29 ENCOUNTER — Emergency Department: Payer: Self-pay

## 2018-05-29 DIAGNOSIS — G510 Bell's palsy: Secondary | ICD-10-CM | POA: Insufficient documentation

## 2018-05-29 DIAGNOSIS — Z79899 Other long term (current) drug therapy: Secondary | ICD-10-CM | POA: Insufficient documentation

## 2018-05-29 DIAGNOSIS — Z5321 Procedure and treatment not carried out due to patient leaving prior to being seen by health care provider: Secondary | ICD-10-CM | POA: Insufficient documentation

## 2018-05-29 DIAGNOSIS — H5711 Ocular pain, right eye: Secondary | ICD-10-CM | POA: Insufficient documentation

## 2018-05-29 NOTE — ED Notes (Signed)
Pt stated that for the past three days he has noticed that the right side of his face, nose, and eye has been drooping. He is not sure if its his allergies or not. He states that his eye is extremely sensitive to light and it hurts when light enters it. Pt is alert and oriented x 4.

## 2018-05-29 NOTE — ED Triage Notes (Signed)
Patient presents to the ED with watery and painful right eye that is sensitive to light.  Patient also reports some right sided facial pain.  Patient states symptoms began 2 days ago.

## 2018-05-29 NOTE — ED Triage Notes (Signed)
Pt to ED with c/o right sided facial pain and light sensitivity and watery right eye x3 days. Pt left ED room earlier WBS due to family issue. Pt back for evaluation.

## 2018-05-29 NOTE — ED Notes (Signed)
Pt apparently left without being seen by provider. Pt not in room per provider

## 2018-05-30 MED ORDER — PREDNISONE 10 MG (21) PO TBPK: ORAL_TABLET | ORAL | 0 refills | Status: DC

## 2018-05-30 MED ORDER — DEXAMETHASONE SODIUM PHOSPHATE 10 MG/ML IJ SOLN
10.0000 mg | Freq: Once | INTRAMUSCULAR | Status: AC
  Administered 2018-05-30: 10 mg via INTRAMUSCULAR
  Filled 2018-05-30: qty 1

## 2018-05-30 NOTE — ED Provider Notes (Signed)
[facility] Emergency Department Provider Note  ____________________________________________   None    (approximate)  I have reviewed the triage vital signs and the nursing notes.   HISTORY  Chief Complaint Facial Pain   HPI Calvin Yoder. is a 47 y.o. male who presents to the emergency department for treatment and evaluation of right eye watering, right eye drooping, and headache/facial pain that started 2 days ago after being outside.  Patient states that he initially thought that it was seasonal allergies and began taking over-the-counter medications which have not helped.  No previous symptoms similar to this in the past.  He states that he went to the pharmacy to inquire about taking some type of over-the-counter medication and it was advised that he should come to the emergency department.  Past Medical History:  Diagnosis Date  . GERD (gastroesophageal reflux disease)   . IBS (irritable bowel syndrome)     There are no active problems to display for this patient.   Past Surgical History:  Procedure Laterality Date  . BACK SURGERY      Prior to Admission medications   Medication Sig Start Date End Date Taking? Authorizing Provider  albuterol (PROVENTIL HFA;VENTOLIN HFA) 108 (90 Base) MCG/ACT inhaler Inhale 2 puffs into the lungs every 6 (six) hours as needed for wheezing or shortness of breath. 10/02/17   Willy Eddy, MD  cetirizine (ZYRTEC) 10 MG tablet Take 1 tablet (10 mg total) by mouth daily. Patient not taking: Reported on 10/02/2017 07/21/16   Cuthriell, Delorise Royals, PA-C  dicyclomine (BENTYL) 20 MG tablet Take 1 tablet (20 mg total) by mouth 3 (three) times daily as needed for up to 15 days for spasms. 03/08/18 03/23/18  Menshew, Charlesetta Ivory, PA-C  fluticasone (FLONASE) 50 MCG/ACT nasal spray Place 1 spray into both nostrils 2 (two) times daily. Patient not taking: Reported on 10/02/2017 07/21/16   Cuthriell, Delorise Royals, PA-C    ibuprofen (ADVIL,MOTRIN) 600 MG tablet Take 1 tablet (600 mg total) by mouth every 6 (six) hours as needed. Patient not taking: Reported on 10/02/2017 10/29/16   Joni Reining, PA-C  Magnesium Hydroxide (MILK OF MAGNESIA PO) Take by mouth every other day.    [provider]  Omeprazole 20 MG TBEC Take 20 mg by mouth daily.    [provider]  ondansetron (ZOFRAN) 4 MG tablet Take 1 tablet (4 mg total) by mouth daily as needed for nausea or vomiting. 10/02/17 10/02/18  Willy Eddy, MD  predniSONE (STERAPRED UNI-PAK 21 TAB) 10 MG (21) TBPK tablet Take 6 tablets on the first day and decrease by 1 tablet each day until finished. 05/30/18   Chinita Pester, FNP    Allergies Patient has no known allergies.  Family History  Problem Relation Age of Onset  . Heart failure Mother   . Hypertension Mother   . Heart failure Father     Social History Social History   Tobacco Use  . Smoking status: Never Smoker  . Smokeless tobacco: Never Used  Substance Use Topics  . Alcohol use: No  . Drug use: No    Review of Systems  Constitutional: No fever/chills Eyes: No visual changes.  Positive for right eye drooping ENT: No sore throat.  Positive for clear rhinorrhea Cardiovascular: Denies chest pain. Respiratory: Denies shortness of breath. Gastrointestinal: No abdominal pain.  No nausea, no vomiting.  No diarrhea.  No constipation. Genitourinary: Negative for dysuria. Musculoskeletal: Negative for back pain. Skin:  Negative for rash. Neurological: Negative for headaches, focal weakness or numbness. ____________________________________________   PHYSICAL EXAM:  VITAL SIGNS: ED Triage Vitals  Enc Vitals Group     BP 05/29/18 2029 (!) 156/99     Pulse Rate 05/29/18 2029 89     Resp 05/29/18 2029 17     Temp 05/29/18 2029 98.1 F (36.7 C)     Temp Source 05/29/18 2029 Oral     SpO2 05/29/18 2029 99 %     Weight 05/29/18 2030 169 lb 1.5 oz (76.7 kg)      Height --      Head Circumference --      Peak Flow --      Pain Score --      Pain Loc --      Pain Edu? --      Excl. in GC? --     Constitutional: Alert and oriented. Well appearing and in no acute distress. Eyes: Conjunctivae are normal. PERRL. EOMI. Head: Atraumatic. Nose: No congestion/rhinnorhea. Mouth/Throat: Mucous membranes are moist.  Oropharynx non-erythematous. Neck: No stridor.   Cardiovascular: Normal rate, regular rhythm. Grossly normal heart sounds.  Good peripheral circulation. Respiratory: Normal respiratory effort. No retractions. Lungs CTAB. Gastrointestinal: Soft and nontender. No distention. No abdominal bruits. No CVA tenderness. Musculoskeletal: No lower extremity tenderness nor edema.  No joint effusions. Neurologic:  Normal speech and language. No gross focal neurologic deficits are appreciated. No gait instability.  Slight droop of the right eyelid is noted on exam.  Forehead on the right side does not rise equal to the left.  Skin:  Skin is warm, dry and intact. No rash noted. Psychiatric: Mood and affect are normal. Speech and behavior are normal.  ____________________________________________   LABS (all labs ordered are listed, but only abnormal results are displayed)  Labs Reviewed - No data to display ____________________________________________  EKG  Not indicated. ____________________________________________  RADIOLOGY  ED MD interpretation:  CT reassuring.  Official radiology report(s): Ct Head Wo Contrast  Result Date: 05/29/2018 CLINICAL DATA:  Headache, right facial pain.  Light sensitivity. EXAM: CT HEAD WITHOUT CONTRAST TECHNIQUE: Contiguous axial images were obtained from the base of the skull through the vertex without intravenous contrast. COMPARISON:  08/25/2007 FINDINGS: Brain: No acute intracranial abnormality. Specifically, no hemorrhage, hydrocephalus, mass lesion, acute infarction, or significant intracranial injury.  Vascular: No hyperdense vessel or unexpected calcification. Skull: No acute calvarial abnormality. Sinuses/Orbits: No acute finding. Other: None IMPRESSION: No intracranial abnormality. Electronically Signed   By: Charlett NoseKevin  Dover M.D.   On: 05/29/2018 23:21    ____________________________________________   PROCEDURES  Procedure(s) performed: None  Procedures  Critical Care performed: No  ____________________________________________   INITIAL IMPRESSION / ASSESSMENT AND PLAN / ED COURSE   47 year old male presenting to the emergency department for treatment and evaluation of right eyelid drooping and watering.  Patient states that he has a strange sensation over the right side of his face and it just feels "heavy."  He also states that 2 days ago upon noticing the symptoms he had a strange headache that radiated from the top of his head around to the right side of his face.  He has not noticed any other changes and has been otherwise healthy.  He assumed that the symptoms were related to seasonal allergies because he was out side fishing the night before the symptoms started.  Symptoms and exam are most consistent with Bell's palsy.  He is not having any difficulty closing the right eye  but it is noted to be drooping slightly.  He will be treated with Decadron and prednisone.  He was encouraged to follow-up with ophthalmology if he feels that his symptoms are not improving or if he begins to have any further irritation or concerns.      ____________________________________________   FINAL CLINICAL IMPRESSION(S) / ED DIAGNOSES  Final diagnoses:  Bell's palsy     ED Discharge Orders         Ordered    predniSONE (STERAPRED UNI-PAK 21 TAB) 10 MG (21) TBPK tablet  Status:  Discontinued     05/30/18 0007    predniSONE (STERAPRED UNI-PAK 21 TAB) 10 MG (21) TBPK tablet     05/30/18 0012           Note:  This document was prepared using Dragon voice recognition software and may  include unintentional dictation errors.    Chinita Pester, FNP 05/30/18 0224    Darci Current, MD 05/30/18 [phone]

## 2018-05-30 NOTE — ED Notes (Signed)
Pt signed hardcopy of discharge disposition. Placed in pt chart

## 2018-05-30 NOTE — Discharge Instructions (Signed)
Please follow up with the ophthalmologist if not improving over the next 2-3 days.  Return to the ER for symptoms that change or worsen if unable to see the specialist.

## 2018-06-09 ENCOUNTER — Other Ambulatory Visit: Payer: Self-pay

## 2018-06-09 ENCOUNTER — Emergency Department
Admission: EM | Admit: 2018-06-09 | Discharge: 2018-06-09 | Disposition: A | Discharge status: Home / Self Care | Payer: Self-pay | Attending: Emergency Medicine | Admitting: Emergency Medicine

## 2018-06-09 DIAGNOSIS — J320 Chronic maxillary sinusitis: Secondary | ICD-10-CM

## 2018-06-09 DIAGNOSIS — J0100 Acute maxillary sinusitis, unspecified: Secondary | ICD-10-CM | POA: Insufficient documentation

## 2018-06-09 DIAGNOSIS — Z79899 Other long term (current) drug therapy: Secondary | ICD-10-CM | POA: Insufficient documentation

## 2018-06-09 MED ORDER — AMOXICILLIN-POT CLAVULANATE 875-125 MG PO TABS: 1.0000 | ORAL_TABLET | Freq: Two times a day (BID) | ORAL | 0 refills | Status: AC

## 2018-06-09 NOTE — ED Provider Notes (Signed)
[facility] Emergency Department Provider Note   ____________________________________________   First MD Initiated Contact with Patient 06/09/18 [phone]     (approximate)  I have reviewed the triage vital signs and the nursing notes.   HISTORY  Chief Complaint Facial Pain   HPI Calvin Yoder. is a 47 y.o. male presents to the emergency department with complaint of sinus pressure and headache that started approximately 2 weeks ago.  Patient was seen recently for Bell's palsy and took a course of steroids.  He is unaware of any fever and denies chills.  He states that currently his right upper teeth are hurting.  He continues to also use Flonase nasal spray for his allergies.  He rates his facial pain as a 9/10.  Past Medical History:  Diagnosis Date  . GERD (gastroesophageal reflux disease)   . IBS (irritable bowel syndrome)     There are no active problems to display for this patient.   Past Surgical History:  Procedure Laterality Date  . BACK SURGERY      Prior to Admission medications   Medication Sig Start Date End Date Taking? Authorizing Provider  albuterol (PROVENTIL HFA;VENTOLIN HFA) 108 (90 Base) MCG/ACT inhaler Inhale 2 puffs into the lungs every 6 (six) hours as needed for wheezing or shortness of breath. 10/02/17   Willy Eddy, MD  amoxicillin-clavulanate (AUGMENTIN) 875-125 MG tablet Take 1 tablet by mouth 2 (two) times daily for 10 days. 06/09/18 06/19/18  Tommi Rumps, PA-C  dicyclomine (BENTYL) 20 MG tablet Take 1 tablet (20 mg total) by mouth 3 (three) times daily as needed for up to 15 days for spasms. 03/08/18 03/23/18  Menshew, Charlesetta Ivory, PA-C  Magnesium Hydroxide (MILK OF MAGNESIA PO) Take by mouth every other day.    [provider]  Omeprazole 20 MG TBEC Take 20 mg by mouth daily.    [provider]    Allergies Patient has no known allergies.  Family History  Problem Relation Age of Onset  .  Heart failure Mother   . Hypertension Mother   . Heart failure Father     Social History Social History   Tobacco Use  . Smoking status: Never Smoker  . Smokeless tobacco: Never Used  Substance Use Topics  . Alcohol use: No  . Drug use: No    Review of Systems Constitutional: No fever/chills Eyes: No visual changes. ENT: No sore throat. Cardiovascular: Denies chest pain. Respiratory: Denies shortness of breath. Gastrointestinal: No abdominal pain.  No nausea, no vomiting.  No diarrhea.  No constipation. Genitourinary: Negative for dysuria. Musculoskeletal: Negative for back pain. Skin: Negative for rash. Neurological: Negative for headaches, focal weakness or numbness. ___________________________________________   PHYSICAL EXAM:  VITAL SIGNS: ED Triage Vitals  Enc Vitals Group     BP 06/09/18 0929 125/86     Pulse Rate 06/09/18 0929 99     Resp 06/09/18 0929 16     Temp 06/09/18 0929 97.7 F (36.5 C)     Temp Source 06/09/18 0929 Oral     SpO2 06/09/18 0929 97 %     Weight 06/09/18 0923 162 lb (73.5 kg)     Height 06/09/18 0923 5\' 7"  (1.702 m)     Head Circumference --      Peak Flow --      Pain Score 06/09/18 0923 9     Pain Loc --      Pain Edu? --  Excl. in GC? --    Constitutional: Alert and oriented. Well appearing and in no acute distress. Eyes: Conjunctivae are normal. PERRL. EOMI. Head: Atraumatic. Nose: Mild congestion/rhinnorhea.  EACs are clear bilaterally.  TMs are dull bilaterally but no erythema or injection.  Landmarks are difficult to visualize secondary to effusion on the right side.  No erythema or injection was noted. Mouth/Throat: Mucous membranes are moist.  Oropharynx non-erythematous.  Moderate tenderness on percussion of the right maxillary sinus. Neck: No stridor.   Hematological/Lymphatic/Immunilogical: No cervical lymphadenopathy. Cardiovascular: Normal rate, regular rhythm. Grossly normal heart sounds.  Good peripheral  circulation. Respiratory: Normal respiratory effort.  No retractions. Lungs CTAB. Musculoskeletal: Moves upper and lower extremities without any difficulty.  Normal gait was noted. Neurologic:  Normal speech and language. No gross focal neurologic deficits are appreciated. No gait instability. Skin:  Skin is warm, dry and intact. Psychiatric: Mood and affect are normal. Speech and behavior are normal.  ____________________________________________   LABS (all labs ordered are listed, but only abnormal results are displayed)  Labs Reviewed - No data to display   PROCEDURES  Procedure(s) performed: None  Procedures  Critical Care performed: No  ____________________________________________   INITIAL IMPRESSION / ASSESSMENT AND PLAN / ED COURSE  As part of my medical decision making, I reviewed the following data within the electronic MEDICAL RECORD NUMBER Notes from prior ED visits and [place] Controlled Substance Database  Patient resents to the emergency department with complaint of right upper dental pain and facial pain for approximately 2 weeks.  Patient was seen recently for Bell's palsy and since that time has improved with the use of steroids.  He has taken over-the-counter medication for his sinuses and also continues to use Flonase nasal spray daily.  Patient believes that he did have a fever last week but presents afebrile today.  Physical and history is consistent with a maxillary sinusitis.  Patient was placed on Augmentin 875 twice daily for 10 days.  He is to continue using Flonase nasal spray.  Tylenol or ibuprofen as needed for facial pain.  He is to follow-up with 1 of the clinics listed on his discharge papers if any continued problems. ____________________________________________   FINAL CLINICAL IMPRESSION(S) / ED DIAGNOSES  Final diagnoses:  Right maxillary sinusitis     ED Discharge Orders         Ordered    amoxicillin-clavulanate (AUGMENTIN) 875-125 MG tablet  2  times daily     06/09/18 1013           Note:  This document was prepared using Dragon voice recognition software and may include unintentional dictation errors.    Tommi Rumps, PA-C 06/09/18 1035    [name], MD 06/09/18 [phone]

## 2018-06-09 NOTE — ED Notes (Signed)
See triage note   Presents with sinus pressure and headache    States sx's started 2 weeks ago afebrile on arrival  Recently treated for Bell's Palsy

## 2018-06-09 NOTE — ED Triage Notes (Signed)
Pt c/o sinus pressure/facial pain for the past 2 weeks.

## 2018-06-09 NOTE — Discharge Instructions (Addendum)
Follow-up with 1 the clinics listed on your discharge papers.  [facility] is [facility] of [facility].  Continue using your Flonase nasal spray.  Begin taking Augmentin 875 twice daily for the next 10 days.  Tylenol or ibuprofen as needed for facial pain.  Increase fluids.

## 2018-08-20 ENCOUNTER — Emergency Department
Admission: EM | Admit: 2018-08-20 | Discharge: 2018-08-20 | Disposition: A | Discharge status: Home / Self Care | Payer: Self-pay | Attending: Emergency Medicine | Admitting: Emergency Medicine

## 2018-08-20 ENCOUNTER — Encounter: Payer: Self-pay | Admitting: Emergency Medicine

## 2018-08-20 ENCOUNTER — Other Ambulatory Visit: Payer: Self-pay

## 2018-08-20 DIAGNOSIS — R05 Cough: Secondary | ICD-10-CM | POA: Insufficient documentation

## 2018-08-20 DIAGNOSIS — J302 Other seasonal allergic rhinitis: Secondary | ICD-10-CM | POA: Insufficient documentation

## 2018-08-20 DIAGNOSIS — R059 Cough, unspecified: Secondary | ICD-10-CM

## 2018-08-20 MED ORDER — ALBUTEROL SULFATE HFA 108 (90 BASE) MCG/ACT IN AERS: 2.0000 | INHALATION_SPRAY | Freq: Four times a day (QID) | RESPIRATORY_TRACT | 0 refills | Status: DC | PRN

## 2018-08-20 MED ORDER — PROMETHAZINE-DM 6.25-15 MG/5ML PO SYRP: 5.0000 mL | ORAL_SOLUTION | Freq: Four times a day (QID) | ORAL | 0 refills | Status: DC | PRN

## 2018-08-20 MED ORDER — DEXAMETHASONE SODIUM PHOSPHATE 10 MG/ML IJ SOLN
10.0000 mg | Freq: Once | INTRAMUSCULAR | Status: AC
  Administered 2018-08-20: 10 mg via INTRAMUSCULAR
  Filled 2018-08-20: qty 1

## 2018-08-20 MED ORDER — CETIRIZINE HCL 10 MG PO TABS: 10.0000 mg | ORAL_TABLET | Freq: Every day | ORAL | 0 refills | Status: DC

## 2018-08-20 NOTE — ED Triage Notes (Signed)
Pt c/o dry cough, pt states he gets inflammation in his bronchioles every year and requires prednisone.    Pt reports cough x 4 days.  Pt is non-smoker.

## 2018-08-20 NOTE — Discharge Instructions (Signed)
You have been diagnosed with allergic rhinitis and cough.  We have given you a steroid injection today.  Continue Flonase.  I have provided you with prescriptions for a daily antihistamine, albuterol inhaler and cough syrup.  Follow-up as needed if symptoms persist or worsen.

## 2018-08-20 NOTE — ED Notes (Signed)
See triage note  Presents with a 4 day hx of cough  States cough is a dry cough   And prevents him from being able to eat  No fever  Hx of same in past states he usually gets this once a year and prednisone helps  Afebrile on arrival

## 2018-08-20 NOTE — ED Provider Notes (Signed)
[facility] Emergency Department Provider Note ____________________________________________  Time seen: 0725  I have reviewed the triage vital signs and the nursing notes.  HISTORY  Chief Complaint  Cough   HPI Calvin DiegoJeffrey L Chalfant Sr. is a 47 y.o. male presents to the ER today with complaint of runny nose and cough.  He reports this started 4 days ago.  He is blowing clear mucus out of his nose.  The cough is nonproductive.  He denies nasal congestion, ear pain, sore throat or shortness of breath.  He denies fever, chills or body aches.  He has tried Flonase, Vicks vapor rub and NyQuil with minimal relief.  He has a history of allergies but no asthma.  He does not smoke.  He has had sick contacts.  Past Medical History:  Diagnosis Date  . GERD (gastroesophageal reflux disease)   . IBS (irritable bowel syndrome)     There are no active problems to display for this patient.   Past Surgical History:  Procedure Laterality Date  . BACK SURGERY      Prior to Admission medications   Medication Sig Start Date End Date Taking? Authorizing Provider  albuterol (PROVENTIL HFA;VENTOLIN HFA) 108 (90 Base) MCG/ACT inhaler Inhale 2 puffs into the lungs every 6 (six) hours as needed for wheezing or shortness of breath. 08/20/18   Lorre MunroeBaity, Morty Ortwein W, NP  cetirizine (ZYRTEC) 10 MG tablet Take 1 tablet (10 mg total) by mouth daily. 08/20/18   Lorre MunroeBaity, Diavian Furgason W, NP  promethazine-dextromethorphan (PROMETHAZINE-DM) 6.25-15 MG/5ML syrup Take 5 mLs by mouth 4 (four) times daily as needed for cough. 08/20/18   Lorre MunroeBaity, Samentha Perham W, NP    Allergies Patient has no known allergies.  Family History  Problem Relation Age of Onset  . Heart failure Mother   . Hypertension Mother   . Heart failure Father     Social History Social History   Tobacco Use  . Smoking status: Never Smoker  . Smokeless tobacco: Never Used  Substance Use Topics  . Alcohol use: No  . Drug use: No    Review of  Systems  Constitutional: Negative for fever, chills or body aches. ENT: Positive for runny nose negative for ear pain, nasal congestion or sore throat. Cardiovascular: Negative for chest pain. Respiratory: Positive for cough.  Negative for shortness of breath. ____________________________________________  PHYSICAL EXAM:  VITAL SIGNS: ED Triage Vitals  Enc Vitals Group     BP 08/20/18 0703 123/89     Pulse Rate 08/20/18 0703 91     Resp --      Temp 08/20/18 0703 98.4 F (36.9 C)     Temp Source 08/20/18 0703 Oral     SpO2 08/20/18 0703 97 %     Weight 08/20/18 0703 160 lb (72.6 kg)     Height 08/20/18 0703 5\' 7"  (1.702 m)     Head Circumference --      Peak Flow --      Pain Score 08/20/18 0707 0     Pain Loc --      Pain Edu? --      Excl. in GC? --     Constitutional: Alert and oriented. Well appearing and in no distress. Head: Normocephalic and atraumatic, no sinus tenderness noted. Nose: No congestion/rhinorrhea/epistaxis. Mouth/Throat: Mucous membranes are moist.  Throat pink, + PND. Hematological/Lymphatic/Immunological: No cervical lymphadenopathy. Cardiovascular: Normal rate, regular rhythm.  Respiratory: Normal respiratory effort. No wheezes/rales/rhonchi. ____________________________________________  INITIAL IMPRESSION / ASSESSMENT AND PLAN / ED  COURSE  Allergic Rhinitis, Cough:  Decadron 10 mg IM today Continue Flonase RX for Zyrtec 10 mg daily x 10 days RX for Albuterol inhaler 1-2 puffs every 4-6 hours prn RX for Promethazine DM cough syrup ____________________________________________  FINAL CLINICAL IMPRESSION(S) / ED DIAGNOSES  Final diagnoses:  Cough  Seasonal allergic rhinitis, unspecified trigger   Nicki Reaper, NP    Lorre Munroe, NP 08/20/18 1610    Governor Rooks, MD 08/20/18 [phone]

## 2018-08-24 ENCOUNTER — Emergency Department
Admission: EM | Admit: 2018-08-24 | Discharge: 2018-08-24 | Disposition: A | Discharge status: Home / Self Care | Payer: Self-pay | Attending: Emergency Medicine | Admitting: Emergency Medicine

## 2018-08-24 ENCOUNTER — Other Ambulatory Visit: Payer: Self-pay

## 2018-08-24 DIAGNOSIS — J9801 Acute bronchospasm: Secondary | ICD-10-CM | POA: Insufficient documentation

## 2018-08-24 MED ORDER — METHYLPREDNISOLONE 4 MG PO TBPK: ORAL_TABLET | ORAL | 0 refills | Status: DC

## 2018-08-24 MED ORDER — HYDROCOD POLST-CPM POLST ER 10-8 MG/5ML PO SUER: 5.0000 mL | Freq: Every evening | ORAL | 0 refills | Status: DC | PRN

## 2018-08-24 MED ORDER — BENZONATATE 100 MG PO CAPS: 200.0000 mg | ORAL_CAPSULE | Freq: Three times a day (TID) | ORAL | 0 refills | Status: DC | PRN

## 2018-08-24 NOTE — ED Provider Notes (Signed)
[facility] Emergency Department Provider Note   ____________________________________________   First MD Initiated Contact with Patient 08/24/18 [phone]     (approximate)  I have reviewed the triage vital signs and the nursing notes.   HISTORY  Chief Complaint Cough    HPI Calvin DiegoJeffrey L Cronce Sr. is a 47 y.o. male patient complained of continued cough secondary to weather change.  Patient states this occurs every season.  Patient was seen in this department 4 days ago and given a steroid shot.  Patient state also given prescription for Bromfed-DM.  Patient states cough persists.  Patient denies fever chills associated this complaint.  Patient cough interfering with her eating and sleeping.  Patient state mild chest wall pain secondary to continued cough.  Patient denies dyspnea.  Past Medical History:  Diagnosis Date  . GERD (gastroesophageal reflux disease)   . IBS (irritable bowel syndrome)     There are no active problems to display for this patient.   Past Surgical History:  Procedure Laterality Date  . BACK SURGERY      Prior to Admission medications   Medication Sig Start Date End Date Taking? Authorizing Provider  albuterol (PROVENTIL HFA;VENTOLIN HFA) 108 (90 Base) MCG/ACT inhaler Inhale 2 puffs into the lungs every 6 (six) hours as needed for wheezing or shortness of breath. 08/20/18   Lorre MunroeBaity, Regina W, NP  benzonatate (TESSALON PERLES) 100 MG capsule Take 2 capsules (200 mg total) by mouth 3 (three) times daily as needed. 08/24/18 08/24/19  Joni ReiningSmith, Tifany Hirsch K, PA-C  cetirizine (ZYRTEC) 10 MG tablet Take 1 tablet (10 mg total) by mouth daily. 08/20/18   Lorre MunroeBaity, Regina W, NP  chlorpheniramine-HYDROcodone (TUSSIONEX PENNKINETIC ER) 10-8 MG/5ML SUER Take 5 mLs by mouth at bedtime as needed for cough. 08/24/18   Joni ReiningSmith, Alisson Rozell K, PA-C  methylPREDNISolone (MEDROL DOSEPAK) 4 MG TBPK tablet Take Tapered dose as directed 08/24/18   Joni ReiningSmith, Saarah Dewing K, PA-C    promethazine-dextromethorphan (PROMETHAZINE-DM) 6.25-15 MG/5ML syrup Take 5 mLs by mouth 4 (four) times daily as needed for cough. 08/20/18   Lorre MunroeBaity, Regina W, NP    Allergies Patient has no known allergies.  Family History  Problem Relation Age of Onset  . Heart failure Mother   . Hypertension Mother   . Heart failure Father     Social History Social History   Tobacco Use  . Smoking status: Never Smoker  . Smokeless tobacco: Never Used  Substance Use Topics  . Alcohol use: No  . Drug use: No    Review of Systems Constitutional: No fever/chills Eyes: No visual changes. ENT: No sore throat. Cardiovascular: Denies chest pain. Respiratory: Denies shortness of breath. Gastrointestinal: No abdominal pain.  No nausea, no vomiting.  No diarrhea.  No constipation. Genitourinary: Negative for dysuria. Musculoskeletal: Negative for back pain. Skin: Negative for rash. Neurological: Negative for headaches, focal weakness or numbness.   ____________________________________________   PHYSICAL EXAM:  VITAL SIGNS: ED Triage Vitals  Enc Vitals Group     BP 08/24/18 0736 (!) 143/104     Pulse Rate 08/24/18 0736 (!) 119     Resp 08/24/18 0736 18     Temp 08/24/18 0736 97.8 F (36.6 C)     Temp Source 08/24/18 0736 Oral     SpO2 08/24/18 0736 97 %     Weight 08/24/18 0737 165 lb (74.8 kg)     Height 08/24/18 0737 5\' 7"  (1.702 m)     Head Circumference --  Peak Flow --      Pain Score 08/24/18 0736 0     Pain Loc --      Pain Edu? --      Excl. in GC? --    Constitutional: Alert and oriented. Well appearing and in no acute distress. Cardiovascular: Normal rate, regular rhythm. Grossly normal heart sounds.  Good peripheral circulation.  Elevated diastolic blood pressure. Respiratory: Normal respiratory effort.  No retractions. Lungs CTAB.  Increased cough with deep inspirations. Skin:  Skin is warm, dry and intact. No rash  noted.  ____________________________________________   LABS (all labs ordered are listed, but only abnormal results are displayed)  Labs Reviewed - No data to display ____________________________________________  EKG   ____________________________________________  RADIOLOGY  ED MD interpretation:    Official radiology report(s): No results found.  ____________________________________________   PROCEDURES  Procedure(s) performed: None  Procedures  Critical Care performed: No  ____________________________________________   INITIAL IMPRESSION / ASSESSMENT AND PLAN / ED COURSE  As part of my medical decision making, I reviewed the following data within the electronic MEDICAL RECORD NUMBER    Cough secondary bronchospasm.  Advised patient continue previous medication and start prednisone and Tessalon Perles.  Patient also given a prescription for Tussionex to take only at night.  Patient [facility]      ____________________________________________   FINAL CLINICAL IMPRESSION(S) / ED DIAGNOSES  Final diagnoses:  Cough due to bronchospasm     ED Discharge Orders         Ordered    methylPREDNISolone (MEDROL DOSEPAK) 4 MG TBPK tablet     08/24/18 0800    benzonatate (TESSALON PERLES) 100 MG capsule  3 times daily PRN     08/24/18 0800    chlorpheniramine-HYDROcodone (TUSSIONEX PENNKINETIC ER) 10-8 MG/5ML SUER  At bedtime PRN     08/24/18 0803           Note:  This document was prepared using Dragon voice recognition software and may include unintentional dictation errors.    Joni Reining, PA-C 08/24/18 6045    Governor Rooks, MD 08/24/18 [phone]

## 2018-08-24 NOTE — ED Notes (Signed)
Says he has deep uncontrollable cough that he seems to get every year. dinies any congestion, just cough.  Seen here for same on Sunday and got steroid shot.  Not better.

## 2018-08-24 NOTE — ED Notes (Signed)
First Nurse Note: Patient complaining of cough.  States he was seen and treated here earlier without improvement and cannot work with the cough.

## 2018-08-24 NOTE — ED Triage Notes (Addendum)
Pt states cough. Sometimes productive. States "every time I get this they give me prednisone, a weeks supply. I think there is inflammation down in my bronicoles" pt points to throat. A&O, ambulatory. Speaking in complete sentences. Denies fever or congestion.

## 2018-08-24 NOTE — Discharge Instructions (Addendum)
Continue previous medication and start prednisone and Tessalon as directed.

## 2018-09-04 ENCOUNTER — Ambulatory Visit
Admission: EM | Admit: 2018-09-04 | Discharge: 2018-09-04 | Disposition: A | Discharge status: Home / Self Care | Payer: Self-pay | Attending: Family Medicine | Admitting: Family Medicine

## 2018-09-04 ENCOUNTER — Other Ambulatory Visit: Payer: Self-pay

## 2018-09-04 DIAGNOSIS — J40 Bronchitis, not specified as acute or chronic: Secondary | ICD-10-CM

## 2018-09-04 DIAGNOSIS — R059 Cough, unspecified: Secondary | ICD-10-CM

## 2018-09-04 DIAGNOSIS — R05 Cough: Secondary | ICD-10-CM

## 2018-09-04 MED ORDER — AZITHROMYCIN 250 MG PO TABS: ORAL_TABLET | ORAL | 0 refills | Status: DC

## 2018-09-04 NOTE — ED Triage Notes (Signed)
Patient complains of cough, congestion, sinus pain and pressure, headaches since 08/16/2018. Patient states that he has been to Emergency Department twice for this and was given Prednisone.

## 2018-09-04 NOTE — ED Provider Notes (Signed)
[facility]    [name]: [id number] Arrival date & time: 09/04/18  1423     History   Chief Complaint Chief Complaint  Patient presents with  . Cough    HPI Maudry DiegoJeffrey L Apple Sr. is a 47 y.o. male.   The history is provided by the patient.  Cough  Associated symptoms: wheezing   URI  Presenting symptoms: cough   Severity:  Moderate Onset quality:  Sudden Duration:  3 weeks Timing:  Constant Progression:  Unchanged Chronicity:  New Relieved by:  Nothing Ineffective treatments:  Prescription medications Associated symptoms: wheezing   Risk factors: sick contacts   Risk factors: not elderly, no chronic cardiac disease, no chronic kidney disease, no chronic respiratory disease, no diabetes mellitus, no immunosuppression, no recent illness and no recent travel     Past Medical History:  Diagnosis Date  . GERD (gastroesophageal reflux disease)   . IBS (irritable bowel syndrome)     There are no active problems to display for this patient.   Past Surgical History:  Procedure Laterality Date  . BACK SURGERY         Home Medications    Prior to Admission medications   Medication Sig Start Date End Date Taking? Authorizing Provider  fluticasone (FLONASE) 50 MCG/ACT nasal spray Place into both nostrils daily.   Yes [provider]  magnesium hydroxide (PHILLIPS CHEWS) 311 MG CHEW chewable tablet Chew 311 mg by mouth every 4 (four) hours as needed.   Yes [provider]  omeprazole (PRILOSEC) 20 MG capsule Take 20 mg by mouth daily.   Yes [provider]  albuterol (PROVENTIL HFA;VENTOLIN HFA) 108 (90 Base) MCG/ACT inhaler Inhale 2 puffs into the lungs every 6 (six) hours as needed for wheezing or shortness of breath. 08/20/18   Lorre MunroeBaity, Regina W, NP  azithromycin (ZITHROMAX Z-PAK) 250 MG tablet 2 tabs po once day 1, then 1 tab po qd for next 4 days 09/04/18   Payton Mccallumonty, Kenzly Rogoff, MD  benzonatate (TESSALON PERLES) 100 MG capsule Take 2  capsules (200 mg total) by mouth 3 (three) times daily as needed. 08/24/18 08/24/19  Joni ReiningSmith, Ronald K, PA-C  cetirizine (ZYRTEC) 10 MG tablet Take 1 tablet (10 mg total) by mouth daily. 08/20/18   Lorre MunroeBaity, Regina W, NP  chlorpheniramine-HYDROcodone (TUSSIONEX PENNKINETIC ER) 10-8 MG/5ML SUER Take 5 mLs by mouth at bedtime as needed for cough. 08/24/18   Joni ReiningSmith, Ronald K, PA-C  methylPREDNISolone (MEDROL DOSEPAK) 4 MG TBPK tablet Take Tapered dose as directed 08/24/18   Joni ReiningSmith, Ronald K, PA-C  promethazine-dextromethorphan (PROMETHAZINE-DM) 6.25-15 MG/5ML syrup Take 5 mLs by mouth 4 (four) times daily as needed for cough. 08/20/18   Lorre MunroeBaity, Regina W, NP    Family History Family History  Problem Relation Age of Onset  . Heart failure Mother   . Hypertension Mother   . Heart failure Father     Social History Social History   Tobacco Use  . Smoking status: Never Smoker  . Smokeless tobacco: Never Used  Substance Use Topics  . Alcohol use: No  . Drug use: No     Allergies   Patient has no known allergies.   Review of Systems Review of Systems  Respiratory: Positive for cough and wheezing.      Physical Exam Triage Vital Signs ED Triage Vitals  Enc Vitals Group     BP 09/04/18 1444 (!) 126/108     Pulse Rate 09/04/18 1444 70     Resp 09/04/18  1444 18     Temp 09/04/18 1444 98.4 F (36.9 C)     Temp Source 09/04/18 1444 Oral     SpO2 09/04/18 1444 99 %     Weight 09/04/18 1442 165 lb (74.8 kg)     Height 09/04/18 1442 5\' 7"  (1.702 m)     Head Circumference --      Peak Flow --      Pain Score 09/04/18 1442 0     Pain Loc --      Pain Edu? --      Excl. in GC? --    No data found.  Updated Vital Signs BP (!) 126/108 (BP Location: Right Arm)   Pulse 70   Temp 98.4 F (36.9 C) (Oral)   Resp 18   Ht 5\' 7"  (1.702 m)   Wt 74.8 kg   SpO2 99%   BMI 25.84 kg/m   Visual Acuity Right Eye Distance:   Left Eye Distance:   Bilateral Distance:    Right Eye Near:     Left Eye Near:    Bilateral Near:     Physical Exam  Constitutional: He appears well-developed and well-nourished. No distress.  HENT:  Head: Normocephalic and atraumatic.  Neck: Normal range of motion. Neck supple. No tracheal deviation present. No thyromegaly present.  Cardiovascular: Normal rate, regular rhythm and normal heart sounds.  Pulmonary/Chest: Effort normal. No stridor. No respiratory distress. He has no wheezes. He has no rales. He exhibits no tenderness.  Diffuse rhonchi  Lymphadenopathy:    He has no cervical adenopathy.  Neurological: He is alert.  Skin: Skin is warm and dry. No rash noted. He is not diaphoretic.  Nursing note and vitals reviewed.    UC Treatments / Results  Labs (all labs ordered are listed, but only abnormal results are displayed) Labs Reviewed - No data to display  EKG None  Radiology No results found.  Procedures Procedures (including critical care time)  Medications Ordered in UC Medications - No data to display  Initial Impression / Assessment and Plan / UC Course  I have reviewed the triage vital signs and the nursing notes.  Pertinent labs & imaging results that were available during my care of the patient were reviewed by me and considered in my medical decision making (see chart for details).      Final Clinical Impressions(s) / UC Diagnoses   Final diagnoses:  Cough  Bronchitis   Discharge Instructions   None    ED Prescriptions    Medication Sig Dispense Auth. Provider   azithromycin (ZITHROMAX Z-PAK) 250 MG tablet 2 tabs po once day 1, then 1 tab po qd for next 4 days 6 each Payton Mccallum, MD      1. diagnosis reviewed with patient 2. rx as per orders above; reviewed possible side effects, interactions, risks and benefits  3. Follow-up prn if symptoms worsen or don't improve Controlled Substance Prescriptions [place] Controlled Substance Registry consulted? Not Applicable   Payton Mccallum, MD 09/04/18  [phone]

## 2018-12-08 ENCOUNTER — Observation Stay
Admission: EM | Admit: 2018-12-08 | Discharge: 2018-12-09 | Disposition: A | Discharge status: Home / Self Care | Payer: Self-pay | Attending: Internal Medicine | Admitting: Internal Medicine

## 2018-12-08 ENCOUNTER — Emergency Department: Payer: Self-pay

## 2018-12-08 ENCOUNTER — Other Ambulatory Visit: Payer: Self-pay

## 2018-12-08 DIAGNOSIS — R079 Chest pain, unspecified: Secondary | ICD-10-CM | POA: Diagnosis present

## 2018-12-08 DIAGNOSIS — G629 Polyneuropathy, unspecified: Secondary | ICD-10-CM | POA: Insufficient documentation

## 2018-12-08 DIAGNOSIS — K219 Gastro-esophageal reflux disease without esophagitis: Secondary | ICD-10-CM | POA: Diagnosis present

## 2018-12-08 DIAGNOSIS — Z8249 Family history of ischemic heart disease and other diseases of the circulatory system: Secondary | ICD-10-CM | POA: Insufficient documentation

## 2018-12-08 DIAGNOSIS — Z791 Long term (current) use of non-steroidal anti-inflammatories (NSAID): Secondary | ICD-10-CM | POA: Insufficient documentation

## 2018-12-08 DIAGNOSIS — Z79899 Other long term (current) drug therapy: Secondary | ICD-10-CM | POA: Insufficient documentation

## 2018-12-08 DIAGNOSIS — F419 Anxiety disorder, unspecified: Secondary | ICD-10-CM | POA: Insufficient documentation

## 2018-12-08 DIAGNOSIS — R0789 Other chest pain: Principal | ICD-10-CM | POA: Insufficient documentation

## 2018-12-08 DIAGNOSIS — K210 Gastro-esophageal reflux disease with esophagitis: Secondary | ICD-10-CM | POA: Insufficient documentation

## 2018-12-08 DIAGNOSIS — K589 Irritable bowel syndrome without diarrhea: Secondary | ICD-10-CM | POA: Insufficient documentation

## 2018-12-08 DIAGNOSIS — M545 Low back pain: Secondary | ICD-10-CM | POA: Insufficient documentation

## 2018-12-08 LAB — CBC WITH DIFFERENTIAL/PLATELET
ABS IMMATURE GRANULOCYTES: 0.08 10*3/uL — AB (ref 0.00–0.07)
BASOS ABS: 0.1 10*3/uL (ref 0.0–0.1)
Basophils Relative: 1 %
EOS PCT: 2 %
Eosinophils Absolute: 0.2 10*3/uL (ref 0.0–0.5)
HCT: 38.8 % — ABNORMAL LOW (ref 39.0–52.0)
HEMOGLOBIN: 13 g/dL (ref 13.0–17.0)
IMMATURE GRANULOCYTES: 1 %
LYMPHS PCT: 32 %
Lymphs Abs: 2.6 10*3/uL (ref 0.7–4.0)
MCH: 28.2 pg (ref 26.0–34.0)
MCHC: 33.5 g/dL (ref 30.0–36.0)
MCV: 84.2 fL (ref 80.0–100.0)
Monocytes Absolute: 0.6 10*3/uL (ref 0.1–1.0)
Monocytes Relative: 8 %
NEUTROS ABS: 4.8 10*3/uL (ref 1.7–7.7)
NEUTROS PCT: 56 %
NRBC: 0 % (ref 0.0–0.2)
Platelets: 253 10*3/uL (ref 150–400)
RBC: 4.61 MIL/uL (ref 4.22–5.81)
RDW: 13.3 % (ref 11.5–15.5)
WBC: 8.3 10*3/uL (ref 4.0–10.5)

## 2018-12-08 LAB — COMPREHENSIVE METABOLIC PANEL
ALT: 79 U/L — ABNORMAL HIGH (ref 0–44)
ANION GAP: 8 (ref 5–15)
AST: 37 U/L (ref 15–41)
Albumin: 4.3 g/dL (ref 3.5–5.0)
Alkaline Phosphatase: 54 U/L (ref 38–126)
BUN: 15 mg/dL (ref 6–20)
CO2: 23 mmol/L (ref 22–32)
Calcium: 8.5 mg/dL — ABNORMAL LOW (ref 8.9–10.3)
Chloride: 107 mmol/L (ref 98–111)
Creatinine, Ser: 1.16 mg/dL (ref 0.61–1.24)
GFR calc non Af Amer: >60 mL/min (ref >60)
Glucose, Bld: 126 mg/dL — ABNORMAL HIGH (ref 70–99)
Potassium: 4 mmol/L (ref 3.5–5.1)
SODIUM: 138 mmol/L (ref 135–145)
Total Bilirubin: 0.5 mg/dL (ref 0.3–1.2)
Total Protein: 7.4 g/dL (ref 6.5–8.1)

## 2018-12-08 LAB — FIBRIN DERIVATIVES D-DIMER (ARMC ONLY): FIBRIN DERIVATIVES D-DIMER (ARMC): 286.37 ng{FEU}/mL (ref 0.00–499.00)

## 2018-12-08 LAB — TROPONIN I: Troponin I: <0.03 ng/mL (ref <0.03)

## 2018-12-08 MED ORDER — NITROGLYCERIN 2 % TD OINT
1.0000 [in_us] | TOPICAL_OINTMENT | Freq: Once | TRANSDERMAL | Status: AC
  Administered 2018-12-08: 1 [in_us] via TOPICAL
  Filled 2018-12-08: qty 1

## 2018-12-08 NOTE — ED Provider Notes (Signed)
[facility] Emergency Department Provider Note   ____________________________________________   First MD Initiated Contact with Patient 12/08/18 1911     (approximate)  I have reviewed the triage vital signs and the nursing notes.   HISTORY  Chief Complaint Chest Pain    HPI Calvin Yoder. is a 48 y.o. male who comes in by EMS for chest pain.  He reports the pain is squeezing and tight heavy but worse with movement and deep breathing.  He says he is never had anything like this.  Earlier today he and his wife move 300 boxes.  Patient afterwards was just walking around when the pain all of a sudden hit him.  He reports family history of multiple people including uncles brother sister mother, grandparents having heart disease.  Congestive heart failure heart attacks and hypertension.  Patient is never had any trouble with any of this.         Past Medical History:  Diagnosis Date  . GERD (gastroesophageal reflux disease)   . IBS (irritable bowel syndrome)     Patient Active Problem List   Diagnosis Date Noted  . Chest pain 12/08/2018  . GERD (gastroesophageal reflux disease) 12/08/2018    Past Surgical History:  Procedure Laterality Date  . BACK SURGERY      Prior to Admission medications   Medication Sig Start Date End Date Taking? Authorizing Provider  cetirizine (ZYRTEC) 10 MG tablet Take 1 tablet (10 mg total) by mouth daily. 08/20/18  Yes Baity, Salvadore Oxford, NP  magnesium hydroxide (PHILLIPS CHEWS) 311 MG CHEW chewable tablet Chew 311 mg by mouth every 4 (four) hours as needed.   Yes [provider]  omeprazole (PRILOSEC) 20 MG capsule Take 20 mg by mouth daily.   Yes [provider]  albuterol (PROVENTIL HFA;VENTOLIN HFA) 108 (90 Base) MCG/ACT inhaler Inhale 2 puffs into the lungs every 6 (six) hours as needed for wheezing or shortness of breath. Patient not taking: Reported on 12/08/2018 08/20/18   Lorre Munroe, NP    azithromycin (ZITHROMAX Z-PAK) 250 MG tablet 2 tabs po once day 1, then 1 tab po qd for next 4 days Patient not taking: Reported on 12/08/2018 09/04/18   Payton Mccallum, MD  benzonatate (TESSALON PERLES) 100 MG capsule Take 2 capsules (200 mg total) by mouth 3 (three) times daily as needed. 08/24/18 08/24/19  Joni Reining, PA-C  chlorpheniramine-HYDROcodone (TUSSIONEX PENNKINETIC ER) 10-8 MG/5ML SUER Take 5 mLs by mouth at bedtime as needed for cough. Patient not taking: Reported on 12/08/2018 08/24/18   Joni Reining, PA-C  fluticasone [facility]) 50 MCG/ACT nasal spray Place into both nostrils daily.    [provider]  gabapentin (NEURONTIN) 300 MG capsule Take 300 mg by mouth 2 (two) times daily.    [provider]  meloxicam (MOBIC) 15 MG tablet Take 15 mg by mouth daily.    [provider]  methylPREDNISolone (MEDROL DOSEPAK) 4 MG TBPK tablet Take Tapered dose as directed Patient not taking: Reported on 12/08/2018 08/24/18   Joni Reining, PA-C  promethazine-dextromethorphan (PROMETHAZINE-DM) 6.25-15 MG/5ML syrup Take 5 mLs by mouth 4 (four) times daily as needed for cough. 08/20/18   Lorre Munroe, NP    Allergies Patient has no known allergies.  Family History  Problem Relation Age of Onset  . Heart failure Mother   . Hypertension Mother   . Heart failure Father     Social History Social History  Tobacco Use  . Smoking status: Never Smoker  . Smokeless tobacco: Never Used  Substance Use Topics  . Alcohol use: No  . Drug use: No    Review of Systems  Constitutional: No fever/chills Eyes: No visual changes. ENT: No sore throat. Cardiovascular:  chest pain. Respiratory:shortness of breath. Gastrointestinal: No abdominal pain.  No nausea, no vomiting.  No diarrhea.  No constipation. Genitourinary: Negative for dysuria. Musculoskeletal: Negative for back pain. Skin: Negative for rash. Neurological: Negative for headaches, focal weakness    ____________________________________________   PHYSICAL EXAM:  VITAL SIGNS: ED Triage Vitals [12/08/18 1910]  Enc Vitals Group     BP      Pulse      Resp      Temp 98.3 F (36.8 C)     Temp Source Oral     SpO2      Weight 171 lb 12.8 oz (77.9 kg)     Height 5\' 7"  (1.702 m)     Head Circumference      Peak Flow      Pain Score 6     Pain Loc      Pain Edu?      Excl. in GC?     Constitutional: Alert and oriented.  Sitting very stiffly looks uncomfortable Eyes: Conjunctivae are normal. Head: Atraumatic. Nose: No congestion/rhinnorhea. Mouth/Throat: Mucous membranes are moist.  Oropharynx non-erythematous. Neck: No stridor.  Cardiovascular: Normal rate, regular rhythm. Grossly normal heart sounds.  Good peripheral circulation. Respiratory: Normal respiratory effort.  No retractions. Lungs CTAB. Gastrointestinal: Soft and nontender. No distention. No abdominal bruits. Musculoskeletal: No lower extremity tenderness nor edema.  . Neurologic:  Normal speech and language. No gross focal neurologic deficits are appreciated.  Skin:  Skin is warm, dry and intact. No rash noted. Psychiatric: Mood and affect are normal. Speech and behavior are normal.  ____________________________________________   LABS (all labs ordered are listed, but only abnormal results are displayed)  Labs Reviewed  COMPREHENSIVE METABOLIC PANEL - Abnormal; Notable for the following components:      Result Value   Glucose, Bld 126 (*)    Calcium 8.5 (*)    ALT 79 (*)    All other components within normal limits  CBC WITH DIFFERENTIAL/PLATELET - Abnormal; Notable for the following components:   HCT 38.8 (*)    Abs Immature Granulocytes 0.08 (*)    All other components within normal limits  TROPONIN I  FIBRIN DERIVATIVES D-DIMER ([facility] ONLY)  TROPONIN I   ____________________________________________  EKG  EKG from EMS reviewed.  There is some T wave flattening in V4 through 6 seems to be  apparently quality of the EKG is not that good there is another EKG which has better quality of has T waves in those leads.  But no ST segment elevation depression however EKG is pending __----------------------------------------- 7:20 PM on 12/08/2018 -----------------------------------------  EKG now done shows normal sinus rhythm rate of 84 normal axis no acute ST-T wave changes __________________________________________  RADIOLOGY  ED MD interpretation:  Official radiology report(s): Dg Chest Portable 1 View  Result Date: 12/08/2018 CLINICAL DATA:  Chest pain since 6 p.m. in today. Increased in the upper left side. EXAM: PORTABLE CHEST 1 VIEW COMPARISON:  10/02/2017. FINDINGS: Normal sized heart. Clear lungs with normal vascularity. Normal appearing bones. IMPRESSION: Normal examination. Electronically Signed   By: Beckie Salts M.D.   On: 12/08/2018 19:42    ____________________________________________   PROCEDURES  Procedure(s) performed (including Critical Care):  Procedures   ____________________________________________   INITIAL IMPRESSION / ASSESSMENT AND PLAN / ED COURSE  Patient with multiple family members who have had coronary artery disease.  I do not think it is safe in this particular case to discharge this gentleman home even though his troponins are negative his EKG is okay.  His chest pain did improve with nitro and aspirin .  We will get him in the hospital stress him and make sure that that is okay before discharging him.              ____________________________________________   FINAL CLINICAL IMPRESSION(S) / ED DIAGNOSES  Final diagnoses:  Nonspecific chest pain     ED Discharge Orders    None       Note:  This document was prepared using Dragon voice recognition software and may include unintentional dictation errors.    Arnaldo Natal, MD 12/08/18 [phone]

## 2018-12-08 NOTE — H&P (Signed)
[facility] -  at [facility]   PATIENT NAME: Calvin Yoder    MR#:  [id number]  DATE OF BIRTH:  [birth year]  DATE OF ADMISSION:  12/08/2018  PRIMARY CARE PHYSICIAN: Patient, No Pcp Per   REQUESTING/REFERRING PHYSICIAN: Darnelle Catalan, MD  CHIEF COMPLAINT:   Chief Complaint  Patient presents with  . Chest Pain    HISTORY OF PRESENT ILLNESS:  Calvin Yoder  is a 48 y.o. male who presents with chief complaint as above.  Patient presents the ED with an episode of chest discomfort.  He describes this as left-sided chest pressure which radiated through to his shoulder blades.  He states that he moved a lot of boxes, unloading them from a truck today.  Subsequently when he was walking around a store he developed his chest pain.  It persisted until arrival in the ED when he received some nitro, which alleviated the pain.  He had 2- troponins in the ED, but he has significant family history of heart disease, and given his typical symptoms hospitalist were called for admission  PAST MEDICAL HISTORY:   Past Medical History:  Diagnosis Date  . GERD (gastroesophageal reflux disease)   . IBS (irritable bowel syndrome)      PAST SURGICAL HISTORY:   Past Surgical History:  Procedure Laterality Date  . BACK SURGERY       SOCIAL HISTORY:   Social History   Tobacco Use  . Smoking status: Never Smoker  . Smokeless tobacco: Never Used  Substance Use Topics  . Alcohol use: No     FAMILY HISTORY:   Family History  Problem Relation Age of Onset  . Heart failure Mother   . Hypertension Mother   . Heart failure Father      DRUG ALLERGIES:  No Known Allergies  MEDICATIONS AT HOME:   Prior to Admission medications   Medication Sig Start Date End Date Taking? Authorizing Provider  cetirizine (ZYRTEC) 10 MG tablet Take 1 tablet (10 mg total) by mouth daily. 08/20/18  Yes Baity, Salvadore Oxford, NP  magnesium hydroxide (PHILLIPS CHEWS) 311 MG CHEW chewable tablet  Chew 311 mg by mouth every 4 (four) hours as needed.   Yes [provider]  omeprazole (PRILOSEC) 20 MG capsule Take 20 mg by mouth daily.   Yes [provider]  albuterol (PROVENTIL HFA;VENTOLIN HFA) 108 (90 Base) MCG/ACT inhaler Inhale 2 puffs into the lungs every 6 (six) hours as needed for wheezing or shortness of breath. Patient not taking: Reported on 12/08/2018 08/20/18   Lorre Munroe, NP  azithromycin (ZITHROMAX Z-PAK) 250 MG tablet 2 tabs po once day 1, then 1 tab po qd for next 4 days Patient not taking: Reported on 12/08/2018 09/04/18   Payton Mccallum, MD  benzonatate (TESSALON PERLES) 100 MG capsule Take 2 capsules (200 mg total) by mouth 3 (three) times daily as needed. 08/24/18 08/24/19  Joni Reining, PA-C  chlorpheniramine-HYDROcodone (TUSSIONEX PENNKINETIC ER) 10-8 MG/5ML SUER Take 5 mLs by mouth at bedtime as needed for cough. Patient not taking: Reported on 12/08/2018 08/24/18   Joni Reining, PA-C  fluticasone [facility]) 50 MCG/ACT nasal spray Place into both nostrils daily.    [provider]  gabapentin (NEURONTIN) 300 MG capsule Take 300 mg by mouth 2 (two) times daily.    [provider]  meloxicam (MOBIC) 15 MG tablet Take 15 mg by mouth daily.    [provider]  methylPREDNISolone (MEDROL DOSEPAK) 4 MG TBPK tablet  Take Tapered dose as directed Patient not taking: Reported on 12/08/2018 08/24/18   Joni Reining, PA-C  promethazine-dextromethorphan (PROMETHAZINE-DM) 6.25-15 MG/5ML syrup Take 5 mLs by mouth 4 (four) times daily as needed for cough. 08/20/18   Lorre Munroe, NP    REVIEW OF SYSTEMS:  Review of Systems  Constitutional: Negative for chills, fever, malaise/fatigue and weight loss.  HENT: Negative for ear pain, hearing loss and tinnitus.   Eyes: Negative for blurred vision, double vision, pain and redness.  Respiratory: Negative for cough, hemoptysis and shortness of breath.   Cardiovascular: Positive for chest  pain. Negative for palpitations, orthopnea and leg swelling.  Gastrointestinal: Negative for abdominal pain, constipation, diarrhea, nausea and vomiting.  Genitourinary: Negative for dysuria, frequency and hematuria.  Musculoskeletal: Negative for back pain, joint pain and neck pain.  Skin:       No acne, rash, or lesions  Neurological: Negative for dizziness, tremors, focal weakness and weakness.  Endo/Heme/Allergies: Negative for polydipsia. Does not bruise/bleed easily.  Psychiatric/Behavioral: Negative for depression. The patient is not nervous/anxious and does not have insomnia.      VITAL SIGNS:   Vitals:   12/08/18 2200 12/08/18 2215 12/08/18 2230 12/08/18 2300  BP: 118/75  108/79 120/84  Pulse: 82 85 90 72  Resp: 18  18 18   Temp:      TempSrc:      SpO2: 94% 96% 94% 98%  Weight:      Height:       Wt Readings from Last 3 Encounters:  12/08/18 77.9 kg  09/04/18 74.8 kg  08/24/18 74.8 kg    PHYSICAL EXAMINATION:  Physical Exam  Vitals reviewed. Constitutional: He is oriented to person, place, and time. He appears well-developed and well-nourished. No distress.  HENT:  Head: Normocephalic and atraumatic.  Mouth/Throat: Oropharynx is clear and moist.  Eyes: Pupils are equal, round, and reactive to light. Conjunctivae and EOM are normal. No scleral icterus.  Neck: Normal range of motion. Neck supple. No JVD present. No thyromegaly present.  Cardiovascular: Normal rate, regular rhythm and intact distal pulses. Exam reveals no gallop and no friction rub.  No murmur heard. Respiratory: Effort normal and breath sounds normal. No respiratory distress. He has no wheezes. He has no rales.  GI: Soft. Bowel sounds are normal. He exhibits no distension. There is no abdominal tenderness.  Musculoskeletal: Normal range of motion.        General: No edema.     Comments: No arthritis, no gout  Lymphadenopathy:    He has no cervical adenopathy.  Neurological: He is alert and  oriented to person, place, and time. No cranial nerve deficit.  No dysarthria, no aphasia  Skin: Skin is warm and dry. No rash noted. No erythema.  Psychiatric: He has a normal mood and affect. His behavior is normal. Judgment and thought content normal.    LABORATORY PANEL:   CBC Recent Labs  Lab 12/08/18 1926  WBC 8.3  HGB 13.0  HCT 38.8*  PLT 253   ------------------------------------------------------------------------------------------------------------------  Chemistries  Recent Labs  Lab 12/08/18 1926  NA 138  K 4.0  CL 107  CO2 23  GLUCOSE 126*  BUN 15  CREATININE 1.16  CALCIUM 8.5*  AST 37  ALT 79*  ALKPHOS 54  BILITOT 0.5   ------------------------------------------------------------------------------------------------------------------  Cardiac Enzymes Recent Labs  Lab 12/08/18 2125  TROPONINI <0.03   ------------------------------------------------------------------------------------------------------------------  RADIOLOGY:  Dg Chest Portable 1 View  Result Date: 12/08/2018 CLINICAL DATA:  Chest  pain since 6 p.m. in today. Increased in the upper left side. EXAM: PORTABLE CHEST 1 VIEW COMPARISON:  10/02/2017. FINDINGS: Normal sized heart. Clear lungs with normal vascularity. Normal appearing bones. IMPRESSION: Normal examination. Electronically Signed   By: Beckie Salts M.D.   On: 12/08/2018 19:42    EKG:   Orders placed or performed during the hospital encounter of 12/08/18  . ED EKG  . ED EKG    IMPRESSION AND PLAN:  Principal Problem:   Chest pain -troponin negative x2 in the ED, will check 1 more time here tonight.  We will keep him on cardiac monitoring, admit him to the cardiac floor, get an echocardiogram and a cardiology consult Active Problems:   GERD (gastroesophageal reflux disease) -home dose PPI  Chart review performed and case discussed with ED provider. Labs, imaging and/or ECG reviewed by provider and discussed with  patient/family. Management plans discussed with the patient and/or family.  DVT PROPHYLAXIS: SubQ lovenox   GI PROPHYLAXIS:  PPI   ADMISSION STATUS: Observation  CODE STATUS: Full  TOTAL TIME TAKING CARE OF THIS PATIENT: 40 minutes.   Barney Drain 12/08/2018, 11:38 PM  [facility]  Office  [phone]  CC: Primary care physician; Patient, No Pcp Per  Note:  This document was prepared using Dragon voice recognition software and may include unintentional dictation errors.

## 2018-12-08 NOTE — ED Notes (Signed)
ED TO INPATIENT HANDOFF REPORT  ED Nurse Name and Phone #: [phone]  S Name/Age/Gender Calvin Yoder Sr. 48 y.o. male Room/Bed: ED26A/ED26A  Code Status   Code Status: Not on file  Home/SNF/Other Home Patient oriented to: self, place, time and situation Is this baseline? Yes   Triage Complete: Triage complete  Chief Complaint Chest pain  Triage Note Pt arrived via [facility] from home with c/o Chest pain. EMS states that pt was at home and around 6 pm started having increased chest pain on upper left. EMS states that pt has increased pressure and tightness on inspiration.    Allergies No Known Allergies  Level of Care/Admitting Diagnosis ED Disposition    ED Disposition Condition Comment   Admit  Hospital Area: [facility] [[id number]]  Level of Care: Telemetry [5]  Diagnosis: Chest pain [016010]  Admitting Physician: Oralia Manis [[id number]]  Attending Physician: Oralia Manis [[id number]]  Bed request comments: 2a  PT Class (Do Not Modify): Observation [104]  PT Acc Code (Do Not Modify): Observation [10022]       B Medical/Surgery History Past Medical History:  Diagnosis Date  . GERD (gastroesophageal reflux disease)   . IBS (irritable bowel syndrome)    Past Surgical History:  Procedure Laterality Date  . BACK SURGERY       A IV Location/Drains/Wounds Patient Lines/Drains/Airways Status   Active Line/Drains/Airways    Name:   Placement date:   Placement time:   Site:   Days:   Peripheral IV 12/08/18 Left Antecubital   12/08/18    -    Antecubital   less than 1          Intake/Output Last 24 hours No intake or output data in the 24 hours ending 12/08/18 2340  Labs/Imaging Results for orders placed or performed during the hospital encounter of 12/08/18 (from the past 48 hour(s))  Comprehensive metabolic panel     Status: Abnormal   Collection Time: 12/08/18  7:26 PM  Result Value Ref Range   Sodium 138 135 - 145 mmol/L   Potassium 4.0 3.5 - 5.1 mmol/L   Chloride 107 98 - 111 mmol/L   CO2 23 22 - 32 mmol/L   Glucose, Bld 126 (H) 70 - 99 mg/dL   BUN 15 6 - 20 mg/dL   Creatinine, Ser 2.70 0.61 - 1.24 mg/dL   Calcium 8.5 (L) 8.9 - 10.3 mg/dL   Total Protein 7.4 6.5 - 8.1 g/dL   Albumin 4.3 3.5 - 5.0 g/dL   AST 37 15 - 41 U/L   ALT 79 (H) 0 - 44 U/L   Alkaline Phosphatase 54 38 - 126 U/L   Total Bilirubin 0.5 0.3 - 1.2 mg/dL   GFR calc non Af Amer >60 >60 mL/min   GFR calc Af Amer >60 >60 mL/min   Anion gap 8 5 - 15    Comment: Performed at [facility], [address] [place], [place] [address]  Troponin I - Once     Status: None   Collection Time: 12/08/18  7:26 PM  Result Value Ref Range   Troponin I <0.03 <0.03 ng/mL    Comment: Performed at [facility], [address] [place], [place] [address]  CBC with Differential     Status: Abnormal   Collection Time: 12/08/18  7:26 PM  Result Value Ref Range   WBC 8.3 4.0 - 10.5 K/uL   RBC 4.61 4.22 - 5.81 MIL/uL   Hemoglobin  13.0 13.0 - 17.0 g/dL   HCT 03.7 (L) 04.8 - 88.9 %   MCV 84.2 80.0 - 100.0 fL   MCH 28.2 26.0 - 34.0 pg   MCHC 33.5 30.0 - 36.0 g/dL   RDW 16.9 45.0 - 38.8 %   Platelets 253 150 - 400 K/uL   nRBC 0.0 0.0 - 0.2 %   Neutrophils Relative % 56 %   Neutro Abs 4.8 1.7 - 7.7 K/uL   Lymphocytes Relative 32 %   Lymphs Abs 2.6 0.7 - 4.0 K/uL   Monocytes Relative 8 %   Monocytes Absolute 0.6 0.1 - 1.0 K/uL   Eosinophils Relative 2 %   Eosinophils Absolute 0.2 0.0 - 0.5 K/uL   Basophils Relative 1 %   Basophils Absolute 0.1 0.0 - 0.1 K/uL   Immature Granulocytes 1 %   Abs Immature Granulocytes 0.08 (H) 0.00 - 0.07 K/uL    Comment: Performed at [facility], [address] [place], [place] [address]  Fibrin derivatives D-Dimer     Status: None   Collection Time: 12/08/18  7:26 PM  Result Value Ref Range   Fibrin derivatives D-dimer ([facility]) 286.37 0.00 - 499.00 ng/mL (FEU)    Comment: (NOTE) <>  Exclusion of Venous Thromboembolism (VTE) - OUTPATIENT ONLY   (Emergency Department or [place])   0-499 ng/ml (FEU): With a low to intermediate pretest probability                      for VTE this test result excludes the diagnosis                      of VTE.   >499 ng/ml (FEU) : VTE not excluded; additional work up for VTE is                      required. <> Testing on Inpatients and Evaluation of Disseminated Intravascular   Coagulation (DIC) Reference Range:   0-499 ng/ml (FEU) Performed at [facility], [address] [place], [place] [address]   Troponin I - Once     Status: None   Collection Time: 12/08/18  9:25 PM  Result Value Ref Range   Troponin I <0.03 <0.03 ng/mL    Comment: Performed at [facility], [address] [place], [place] [address]   Dg Chest Portable 1 View  Result Date: 12/08/2018 CLINICAL DATA:  Chest pain since 6 p.m. in today. Increased in the upper left side. EXAM: PORTABLE CHEST 1 VIEW COMPARISON:  10/02/2017. FINDINGS: Normal sized heart. Clear lungs with normal vascularity. Normal appearing bones. IMPRESSION: Normal examination. Electronically Signed   By: Beckie Salts M.D.   On: 12/08/2018 19:42    Pending Labs [employer] (From admission, onward)    Start     Ordered   Signed and Held  HIV antibody (Routine Testing)  Once,   R     Signed and Held   Signed and Held  CBC  (enoxaparin (LOVENOX)    CrCl >/= 30 ml/min)  Once,   R    Comments:  Baseline for enoxaparin therapy IF NOT ALREADY DRAWN.  Notify MD if PLT < 100 K.    Signed and Held   Signed and Held  Creatinine, serum  (enoxaparin (LOVENOX)    CrCl >/= 30 ml/min)  Once,   R    Comments:  Baseline for enoxaparin therapy IF NOT ALREADY DRAWN.  Signed and Held   Signed and Held  Creatinine, serum  (enoxaparin (LOVENOX)    CrCl >/= 30 ml/min)  Weekly,   R    Comments:  while on enoxaparin therapy    Signed and Held   Signed and Held  Basic metabolic panel   Tomorrow morning,   R     Signed and Held   Signed and Held  CBC  Tomorrow morning,   R     Signed and Held   Signed and Held  Troponin I - Once  Once,   R     Signed and Held          Vitals/Pain Today's Vitals   12/08/18 2200 12/08/18 2215 12/08/18 2230 12/08/18 2300  BP: 118/75  108/79 120/84  Pulse: 82 85 90 72  Resp: Temp:      TempSrc:      SpO2: 94% 96% 94% 98%  Weight:      Height:      PainSc:    0-No pain    Isolation Precautions No active isolations  Medications Medications  nitroGLYCERIN (NITROGLYN) 2 % ointment 1 inch (1 inch Topical Given 12/08/18 1924)    Mobility walks Low fall risk   Focused Assessments Cardiac Assessment Handoff:  Cardiac Rhythm: Normal sinus rhythm Lab Results  Component Value Date   CKTOTAL 227 06/30/2013   CKMB 3.7 (H) 06/30/2013   TROPONINI <0.03 12/08/2018   No results found for: DDIMER Does the Patient currently have chest pain? No     R Recommendations: See Admitting Provider Note  Report given to:   Additional Notes: Trop: Neg, 1 inch nitro paste to Left Chest, family hx of cardiac issues

## 2018-12-08 NOTE — ED Triage Notes (Signed)
Pt arrived via [facility] from home with c/o Chest pain. EMS states that pt was at home and around 6 pm started having increased chest pain on upper left. EMS states that pt has increased pressure and tightness on inspiration.

## 2018-12-09 ENCOUNTER — Observation Stay
Admit: 2018-12-09 | Discharge: 2018-12-09 | Disposition: A | Discharge status: Home / Self Care | Payer: Self-pay | Attending: Internal Medicine | Admitting: Internal Medicine

## 2018-12-09 ENCOUNTER — Observation Stay: Payer: Self-pay

## 2018-12-09 DIAGNOSIS — R079 Chest pain, unspecified: Secondary | ICD-10-CM

## 2018-12-09 LAB — NM MYOCAR MULTI W/SPECT W/WALL MOTION / EF
CHL CUP RESTING HR STRESS: 80 {beats}/min
CSEPEW: 1 METS
Exercise duration (min): 1 min
Exercise duration (sec): 0 s
LV dias vol: 73 mL (ref 62–150)
LV sys vol: 17 mL
MPHR: 171 {beats}/min
Peak HR: 137 {beats}/min
Percent HR: 80 %
SDS: 0
SRS: 3
SSS: 0
TID: 0.68

## 2018-12-09 LAB — CBC
HCT: 37.2 % — ABNORMAL LOW (ref 39.0–52.0)
Hemoglobin: 12.7 g/dL — ABNORMAL LOW (ref 13.0–17.0)
MCH: 28.9 pg (ref 26.0–34.0)
MCHC: 34.1 g/dL (ref 30.0–36.0)
MCV: 84.7 fL (ref 80.0–100.0)
Platelets: 246 10*3/uL (ref 150–400)
RBC: 4.39 MIL/uL (ref 4.22–5.81)
RDW: 13.2 % (ref 11.5–15.5)
WBC: 7.6 10*3/uL (ref 4.0–10.5)
nRBC: 0 % (ref 0.0–0.2)

## 2018-12-09 LAB — BASIC METABOLIC PANEL
Anion gap: 8 (ref 5–15)
BUN: 17 mg/dL (ref 6–20)
CO2: 24 mmol/L (ref 22–32)
Calcium: 8.4 mg/dL — ABNORMAL LOW (ref 8.9–10.3)
Chloride: 107 mmol/L (ref 98–111)
Creatinine, Ser: 1.1 mg/dL (ref 0.61–1.24)
GFR calc Af Amer: >60 mL/min (ref >60)
GFR calc non Af Amer: >60 mL/min (ref >60)
Glucose, Bld: 94 mg/dL (ref 70–99)
Potassium: 3.9 mmol/L (ref 3.5–5.1)
Sodium: 139 mmol/L (ref 135–145)

## 2018-12-09 LAB — TROPONIN I

## 2018-12-09 MED ORDER — ONDANSETRON HCL 4 MG PO TABS: 4.0000 mg | ORAL_TABLET | Freq: Four times a day (QID) | ORAL | Status: DC | PRN

## 2018-12-09 MED ORDER — ACETAMINOPHEN 650 MG RE SUPP: 650.0000 mg | Freq: Four times a day (QID) | RECTAL | Status: DC | PRN

## 2018-12-09 MED ORDER — PANTOPRAZOLE SODIUM 20 MG PO TBEC
20.0000 mg | DELAYED_RELEASE_TABLET | Freq: Every day | ORAL | Status: DC
  Administered 2018-12-09: 20 mg via ORAL
  Filled 2018-12-09: qty 1

## 2018-12-09 MED ORDER — TECHNETIUM TC 99M TETROFOSMIN IV KIT
33.2400 | PACK | Freq: Once | INTRAVENOUS | Status: AC | PRN
  Administered 2018-12-09: 33.24 via INTRAVENOUS

## 2018-12-09 MED ORDER — REGADENOSON 0.4 MG/5ML IV SOLN
0.4000 mg | Freq: Once | INTRAVENOUS | Status: AC
  Administered 2018-12-09: 0.4 mg via INTRAVENOUS

## 2018-12-09 MED ORDER — ACETAMINOPHEN 325 MG PO TABS: 650.0000 mg | ORAL_TABLET | Freq: Four times a day (QID) | ORAL | Status: DC | PRN

## 2018-12-09 MED ORDER — ENOXAPARIN SODIUM 40 MG/0.4ML ~~LOC~~ SOLN: 40.0000 mg | SUBCUTANEOUS | Status: DC

## 2018-12-09 MED ORDER — PERFLUTREN LIPID MICROSPHERE
1.0000 mL | INTRAVENOUS | Status: AC | PRN
  Administered 2018-12-09: 3.5 mL via INTRAVENOUS
  Filled 2018-12-09: qty 10

## 2018-12-09 MED ORDER — TECHNETIUM TC 99M TETROFOSMIN IV KIT
10.5600 | PACK | Freq: Once | INTRAVENOUS | Status: AC | PRN
  Administered 2018-12-09: 10.56 via INTRAVENOUS

## 2018-12-09 MED ORDER — ONDANSETRON HCL 4 MG/2ML IJ SOLN: 4.0000 mg | Freq: Four times a day (QID) | INTRAMUSCULAR | Status: DC | PRN

## 2018-12-09 NOTE — Progress Notes (Signed)
RN removed his IV and gave his heart monitor to the secretary.    Patient will leave by private vehicle with his wife.   Christean Grief, RN

## 2018-12-09 NOTE — Consult Note (Addendum)
Cardiology Consultation Note    Patient ID: Calvin Derman., MRN: [id number], DOB/AGE: [birth year] 48 y.o. Admit date: 12/08/2018   Date of Consult: 12/09/2018 Primary Physician: Patient, No Pcp Per Primary Cardiologist: none  Chief Complaint: chest pain Reason for Consultation: chest pain Requesting MD: Dr. Nemiah Commander  HPI: Calvin Martyr Sr. is a 48 y.o. male with history of irritable bowel syndrome and reflux esophagitis.  He presented to the emergency room with complaints of chest pain.  Is extremely anxious.  He has multiple family members with coronary disease.  There were no acute injury patterns on electrocardiogram.  He ruled out for myocardial infarction.  He is currently pain-free.  He states he has done a lot of heavy lifting prior to the event.  Past Medical History:  Diagnosis Date  . GERD (gastroesophageal reflux disease)   . IBS (irritable bowel syndrome)       Surgical History:  Past Surgical History:  Procedure Laterality Date  . BACK SURGERY       Home Meds: Prior to Admission medications   Medication Sig Start Date End Date Taking? Authorizing Provider  cetirizine (ZYRTEC) 10 MG tablet Take 1 tablet (10 mg total) by mouth daily. 08/20/18  Yes Baity, Salvadore Oxford, NP  magnesium hydroxide (PHILLIPS CHEWS) 311 MG CHEW chewable tablet Chew 311 mg by mouth every 4 (four) hours as needed.   Yes [provider]  omeprazole (PRILOSEC) 20 MG capsule Take 20 mg by mouth daily.   Yes [provider]  albuterol (PROVENTIL HFA;VENTOLIN HFA) 108 (90 Base) MCG/ACT inhaler Inhale 2 puffs into the lungs every 6 (six) hours as needed for wheezing or shortness of breath. Patient not taking: Reported on 12/08/2018 08/20/18   Lorre Munroe, NP  azithromycin (ZITHROMAX Z-PAK) 250 MG tablet 2 tabs po once day 1, then 1 tab po qd for next 4 days Patient not taking: Reported on 12/08/2018 09/04/18   Payton Mccallum, MD  benzonatate (TESSALON PERLES) 100 MG capsule Take 2  capsules (200 mg total) by mouth 3 (three) times daily as needed. 08/24/18 08/24/19  Joni Reining, PA-C  chlorpheniramine-HYDROcodone (TUSSIONEX PENNKINETIC ER) 10-8 MG/5ML SUER Take 5 mLs by mouth at bedtime as needed for cough. Patient not taking: Reported on 12/08/2018 08/24/18   Joni Reining, PA-C  fluticasone [facility]) 50 MCG/ACT nasal spray Place into both nostrils daily.    [provider]  gabapentin (NEURONTIN) 300 MG capsule Take 300 mg by mouth 2 (two) times daily.    [provider]  meloxicam (MOBIC) 15 MG tablet Take 15 mg by mouth daily.    [provider]  methylPREDNISolone (MEDROL DOSEPAK) 4 MG TBPK tablet Take Tapered dose as directed Patient not taking: Reported on 12/08/2018 08/24/18   Joni Reining, PA-C  promethazine-dextromethorphan (PROMETHAZINE-DM) 6.25-15 MG/5ML syrup Take 5 mLs by mouth 4 (four) times daily as needed for cough. 08/20/18   Lorre Munroe, NP    Inpatient Medications:  . enoxaparin (LOVENOX) injection  40 mg Subcutaneous Q24H  . pantoprazole  20 mg Oral Daily     Allergies: No Known Allergies  Social History   Socioeconomic History  . Marital status: Married    Spouse name: Not on file  . Number of children: Not on file  . Years of education: Not on file  . Highest education level: Not on file  Occupational History  . Not on file  Social Needs  . Financial resource strain: Not  on file  . Food insecurity:    Worry: Not on file    Inability: Not on file  . Transportation needs:    Medical: Not on file    Non-medical: Not on file  Tobacco Use  . Smoking status: Never Smoker  . Smokeless tobacco: Never Used  Substance and Sexual Activity  . Alcohol use: No  . Drug use: No  . Sexual activity: Not on file  Lifestyle  . Physical activity:    Days per week: Not on file    Minutes per session: Not on file  . Stress: Not on file  Relationships  . Social connections:    Talks on phone: Not on file     Gets together: Not on file    Attends religious service: Not on file    Active member of club or organization: Not on file    Attends meetings of clubs or organizations: Not on file    Relationship status: Not on file  . Intimate partner violence:    Fear of current or ex partner: Not on file    Emotionally abused: Not on file    Physically abused: Not on file    Forced sexual activity: Not on file  Other Topics Concern  . Not on file  Social History Narrative  . Not on file     Family History  Problem Relation Age of Onset  . Heart failure Mother   . Hypertension Mother   . Heart failure Father      Review of Systems: A 12-system review of systems was performed and is negative except as noted in the HPI.  Labs: Recent Labs    12/08/18 1926 12/08/18 2125 12/09/18 0501  TROPONINI <0.03 <0.03 <0.03   Lab Results  Component Value Date   WBC 7.6 12/09/2018   HGB 12.7 (L) 12/09/2018   HCT 37.2 (L) 12/09/2018   MCV 84.7 12/09/2018   PLT 246 12/09/2018    Recent Labs  Lab 12/08/18 1926 12/09/18 0501  NA 138 139  K 4.0 3.9  CL 107 107  CO2 23 24  BUN 15 17  CREATININE 1.16 1.10  CALCIUM 8.5* 8.4*  PROT 7.4  --   BILITOT 0.5  --   ALKPHOS 54  --   ALT 79*  --   AST 37  --   GLUCOSE 126* 94   No results found for: CHOL, HDL, LDLCALC, TRIG No results found for: DDIMER  Radiology/Studies:  Dg Chest Portable 1 View  Result Date: 12/08/2018 CLINICAL DATA:  Chest pain since 6 p.m. in today. Increased in the upper left side. EXAM: PORTABLE CHEST 1 VIEW COMPARISON:  10/02/2017. FINDINGS: Normal sized heart. Clear lungs with normal vascularity. Normal appearing bones. IMPRESSION: Normal examination. Electronically Signed   By: Beckie Salts M.D.   On: 12/08/2018 19:42    Wt Readings from Last 3 Encounters:  12/09/18 76.3 kg  09/04/18 74.8 kg  08/24/18 74.8 kg    EKG: Sinus rhythm with no ischemia  Physical Exam:  Blood pressure 103/71, pulse 71, temperature  98.3 F (36.8 C), temperature source Oral, resp. rate 18, height 5\' 7"  (1.702 m), weight 76.3 kg, SpO2 100 %. Body mass index is 26.36 kg/m. General: Well developed, well nourished, in no acute distress. Head: Normocephalic, atraumatic, sclera non-icteric, no xanthomas, nares are without discharge.  Neck: Negative for carotid bruits. JVD not elevated. Lungs: Clear bilaterally to auscultation without wheezes, rales, or rhonchi. Breathing is unlabored. Heart: RRR  with S1 S2. No murmurs, rubs, or gallops appreciated. Abdomen: Soft, non-tender, non-distended with normoactive bowel sounds. No hepatomegaly. No rebound/guarding. No obvious abdominal masses. Msk:  Strength and tone appear normal for age. Extremities: No clubbing or cyanosis. No edema.  Distal pedal pulses are 2+ and equal bilaterally. Neuro: Alert and oriented X 3. No facial asymmetry. No focal deficit. Moves all extremities spontaneously. Psych:  Responds to questions appropriately with a normal affect.     Assessment and Plan  48 year old male with no prior cardiac history but strong family history of heart disease presenting with complaints of chest pain after doing some heavy lifting.  Ruled out for myocardial infarction.  Electrocardiogram unremarkable.  Underwent a functional study this morning with Lexiscan stress due to leg discomfort.  EKG was unremarkable.  If images are negative, will discharge home with risk factor modification.  We will follow-up as an outpatient as desired.   Signed, Dalia Heading MD 12/09/2018, 10:35 AM Pager: [phone]   Addendum:  Myoview negative with no evidence of ischemia. OK for discharge. Needs no meds for now. OK to follow up with me if he desires.

## 2018-12-09 NOTE — Plan of Care (Signed)
  Problem: Activity: Goal: Ability to tolerate increased activity will improve Outcome: Progressing   

## 2018-12-09 NOTE — Progress Notes (Signed)
*  PRELIMINARY RESULTS* Echocardiogram 2D Echocardiogram has been performed. Definity IV Contrast used on this study.  Calvin Yoder Calvin Yoder 12/09/2018, 12:07 PM

## 2018-12-09 NOTE — Progress Notes (Signed)
     Calvin Yoder was admitted to the [facility] on 12/08/2018 for an acute medical condition and is being Discharged on  12/09/2018 . He is doing better now. Should be able to return to his regular activities without any restrictions from 12/10/2018.  So, please excuse him for the above days.  Call Enid Baas  MD, [facility] at  [phone] with questions.  Enid Baas M.D on 12/09/2018,at 12:53 PM  [facility] [address] [place] [address]

## 2018-12-10 LAB — ECHOCARDIOGRAM COMPLETE
Height: 67 in
Weight: 2692.8 oz

## 2018-12-10 NOTE — Discharge Summary (Signed)
[facility] [place] [facility]   PATIENT NAME: Calvin Yoder    MR#:  [id number]  DATE OF BIRTH:  09/19/1971  DATE OF ADMISSION:  12/08/2018   ADMITTING PHYSICIAN: Oralia Manis, MD  DATE OF DISCHARGE: 12/09/2018  1:17 PM  PRIMARY CARE PHYSICIAN: Patient, No Pcp Per   ADMISSION DIAGNOSIS:   Nonspecific chest pain [R07.9]  DISCHARGE DIAGNOSIS:   Principal Problem:   Chest pain Active Problems:   GERD (gastroesophageal reflux disease)   SECONDARY DIAGNOSIS:   Past Medical History:  Diagnosis Date  . GERD (gastroesophageal reflux disease)   . IBS (irritable bowel syndrome)     HOSPITAL COURSE:   48 year old male with past medical history significant for GERD, irritable bowel syndrome, anxiety presents to hospital secondary to chest pain radiating through his shoulder blades  1.  Chest pain-musculoskeletal chest pain.  Started a day after unloading heavy stuff from a truck. -Patient was very anxious and concerned due to family history of coronary artery disease in young age. -He was admitted under observation to telemetry, no significant EKG changes. -Troponins remain negative.  Myoview was done which was a low risk study, no ischemia identified. -He is being discharged home.  Advised to take Mobic as needed  2.  Neuropathy and low back pain-continue gabapentin  3.  GERD-on PPI  Patient is up and ambulatory.  Will be discharged home today  DISCHARGE CONDITIONS:   Stable  CONSULTS OBTAINED:   Treatment Team:  Dalia Heading, MD  DRUG ALLERGIES:   No Known Allergies DISCHARGE MEDICATIONS:   Allergies as of 12/09/2018   No Known Allergies     Medication List    STOP taking these medications   albuterol 108 (90 Base) MCG/ACT inhaler Commonly known as:  PROVENTIL HFA;VENTOLIN HFA   azithromycin 250 MG tablet Commonly known as:  Zithromax Z-Pak   benzonatate 100 MG capsule Commonly known as:  Tessalon Perles     chlorpheniramine-HYDROcodone 10-8 MG/5ML Suer Commonly known as:  Tussionex Pennkinetic ER   methylPREDNISolone 4 MG Tbpk tablet Commonly known as:  MEDROL DOSEPAK     TAKE these medications   cetirizine 10 MG tablet Commonly known as:  ZYRTEC Take 1 tablet (10 mg total) by mouth daily.   fluticasone 50 MCG/ACT nasal spray Commonly known as:  FLONASE Place into both nostrils daily.   gabapentin 300 MG capsule Commonly known as:  NEURONTIN Take 300 mg by mouth 2 (two) times daily.   magnesium hydroxide 311 MG Chew chewable tablet Commonly known as:  PHILLIPS CHEWS Chew 311 mg by mouth every 4 (four) hours as needed.   Mobic 15 MG tablet Generic drug:  meloxicam Take 15 mg by mouth daily.   omeprazole 20 MG capsule Commonly known as:  PRILOSEC Take 20 mg by mouth daily.   promethazine-dextromethorphan 6.25-15 MG/5ML syrup Commonly known as:  PROMETHAZINE-DM Take 5 mLs by mouth 4 (four) times daily as needed for cough.        DISCHARGE INSTRUCTIONS:   1.  PCP follow-up in 1 to 2 weeks  DIET:   Cardiac diet  ACTIVITY:   Activity as tolerated  OXYGEN:   Home Oxygen: No.  Oxygen Delivery: room air  DISCHARGE LOCATION:   home   If you experience worsening of your admission symptoms, develop shortness of breath, life threatening emergency, suicidal or homicidal thoughts you must seek medical attention immediately by calling 911 or calling your MD immediately  if symptoms less severe.  You Must read complete instructions/literature along with all the possible adverse reactions/side effects for all the Medicines you take and that have been prescribed to you. Take any new Medicines after you have completely understood and accpet all the possible adverse reactions/side effects.   Please note  You were cared for by a hospitalist during your hospital stay. If you have any questions about your discharge medications or the care you received while you were in the  hospital after you are discharged, you can call the unit and asked to speak with the hospitalist on call if the hospitalist that took care of you is not available. Once you are discharged, your primary care physician will handle any further medical issues. Please note that NO REFILLS for any discharge medications will be authorized once you are discharged, as it is imperative that you return to your primary care physician (or establish a relationship with a primary care physician if you do not have one) for your aftercare needs so that they can reassess your need for medications and monitor your lab values.    On the day of Discharge:  VITAL SIGNS:   Blood pressure 103/71, pulse 71, temperature 98.3 F (36.8 C), temperature source Oral, resp. rate 18, height  (1.702 m), weight 76.3 kg, SpO2 100 %.  PHYSICAL EXAMINATION:    GENERAL:  48 y.o.-year-old patient lying in the bed with no acute distress.  EYES: Pupils equal, round, reactive to light and accommodation. No scleral icterus. Extraocular muscles intact.  HEENT: Head atraumatic, normocephalic. Oropharynx and nasopharynx clear.  NECK:  Supple, no jugular venous distention. No thyroid enlargement, no tenderness.  LUNGS: Normal breath sounds bilaterally, no wheezing, rales,rhonchi or crepitation. No use of accessory muscles of respiration.  CARDIOVASCULAR: S1, S2 normal. No murmurs, rubs, or gallops.  No chest wall tenderness on exam ABDOMEN: Soft, non-tender, non-distended. Bowel sounds present. No organomegaly or mass.  EXTREMITIES: No pedal edema, cyanosis, or clubbing.  NEUROLOGIC: Cranial nerves II through XII are intact. Muscle strength 5/5 in all extremities. Sensation intact. Gait not checked.  PSYCHIATRIC: The patient is alert and oriented x 3.  SKIN: No obvious rash, lesion, or ulcer.   DATA REVIEW:   CBC Recent Labs  Lab 12/09/18 0501  WBC 7.6  HGB 12.7*  HCT 37.2*  PLT 246    Chemistries  Recent Labs  Lab  12/08/18 1926 12/09/18 0501  NA 138 139  K 4.0 3.9  CL 107 107  CO2 23 24  GLUCOSE 126* 94  BUN 15 17  CREATININE 1.16 1.10  CALCIUM 8.5* 8.4*  AST 37  --   ALT 79*  --   ALKPHOS 54  --   BILITOT 0.5  --      Microbiology Results  No results found for this or any previous visit.  RADIOLOGY:  Nm Myocar Multi W/spect W/wall Motion / Ef  Result Date: 12/09/2018  The study is normal.  This is a low risk study.  The left ventricular ejection fraction is normal (55-65%).  There was no ST segment deviation noted during stress.  Normal stress myoview. No ischemia Normal lv function     Management plans discussed with the patient, family and they are in agreement.  CODE STATUS:  Code Status History    Date Active Date Inactive Code Status Order ID Comments User Context   12/09/2018 0028 12/09/2018 1617 Full Code [id number]  Oralia Manis, MD Inpatient      TOTAL TIME TAKING CARE OF  THIS PATIENT: 38 minutes.    Enid Baas M.D on 12/10/2018 at 7:38 AM  Between 7am to 6pm - Pager - [phone]  After 6pm go to [email or web address] - Social research officer, government  [facility]  Office  [phone]  CC: Primary care physician; Patient, No Pcp Per   Note: This dictation was prepared with Dragon dictation along with smaller phrase technology. Any transcriptional errors that result from this process are unintentional.

## 2018-12-12 LAB — HIV ANTIBODY (ROUTINE TESTING W REFLEX): HIV Screen 4th Generation wRfx: NONREACTIVE

## 2019-08-12 ENCOUNTER — Other Ambulatory Visit: Payer: Self-pay

## 2019-08-12 ENCOUNTER — Encounter: Payer: Self-pay | Admitting: Emergency Medicine

## 2019-08-12 ENCOUNTER — Emergency Department: Payer: Self-pay

## 2019-08-12 DIAGNOSIS — R0789 Other chest pain: Secondary | ICD-10-CM | POA: Insufficient documentation

## 2019-08-12 DIAGNOSIS — Z79899 Other long term (current) drug therapy: Secondary | ICD-10-CM | POA: Insufficient documentation

## 2019-08-12 LAB — BASIC METABOLIC PANEL
Anion gap: 11 (ref 5–15)
BUN: 17 mg/dL (ref 6–20)
CO2: 20 mmol/L — ABNORMAL LOW (ref 22–32)
Calcium: 8.9 mg/dL (ref 8.9–10.3)
Chloride: 107 mmol/L (ref 98–111)
Creatinine, Ser: 1 mg/dL (ref 0.61–1.24)
GFR calc Af Amer: >60 mL/min (ref >60)
GFR calc non Af Amer: >60 mL/min (ref >60)
Glucose, Bld: 95 mg/dL (ref 70–99)
Potassium: 3.4 mmol/L — ABNORMAL LOW (ref 3.5–5.1)
Sodium: 138 mmol/L (ref 135–145)

## 2019-08-12 LAB — CBC
HCT: 36.4 % — ABNORMAL LOW (ref 39.0–52.0)
Hemoglobin: 12.6 g/dL — ABNORMAL LOW (ref 13.0–17.0)
MCH: 28.3 pg (ref 26.0–34.0)
MCHC: 34.6 g/dL (ref 30.0–36.0)
MCV: 81.6 fL (ref 80.0–100.0)
Platelets: 270 10*3/uL (ref 150–400)
RBC: 4.46 MIL/uL (ref 4.22–5.81)
RDW: 13 % (ref 11.5–15.5)
WBC: 9 10*3/uL (ref 4.0–10.5)
nRBC: 0 % (ref 0.0–0.2)

## 2019-08-12 LAB — TROPONIN I (HIGH SENSITIVITY): Troponin I (High Sensitivity): 3 ng/L (ref <18)

## 2019-08-12 NOTE — ED Notes (Signed)
EMS also reports pt getting 324 mg aspirin and 2 sprays of nitro intranasal.

## 2019-08-12 NOTE — ED Triage Notes (Signed)
Pt arrives via [facility] with c/o chest tightness x 2 hours ago. Pt states that he has been working 2 jobs due to needing enough work at this time. Pt is pushing himself in order to make ends meet at this time and is very tearful in triage.

## 2019-08-13 ENCOUNTER — Emergency Department
Admission: EM | Admit: 2019-08-13 | Discharge: 2019-08-13 | Disposition: A | Discharge status: Home / Self Care | Payer: Self-pay | Attending: Emergency Medicine | Admitting: Emergency Medicine

## 2019-08-13 DIAGNOSIS — R0789 Other chest pain: Secondary | ICD-10-CM

## 2019-08-13 LAB — TROPONIN I (HIGH SENSITIVITY): Troponin I (High Sensitivity): 3 ng/L (ref <18)

## 2019-08-13 NOTE — ED Notes (Signed)
Patient to stat desk in no acute distress stating that he is going to leave. Patient states that he is feeling better at this time. Patient encouraged to stay. Patient agrees to stay at this time.

## 2019-08-13 NOTE — Discharge Instructions (Signed)

## 2019-08-13 NOTE — ED Provider Notes (Signed)
[facility] Emergency Department Provider Note  ____________________________________________   First MD Initiated Contact with Patient 08/13/19 [phone]     (approximate)  I have reviewed the triage vital signs and the nursing notes.   HISTORY  Chief Complaint Chest Pain    HPI Calvin Yoder. is a 48 y.o. male with medical history as listed below who generally is quite active and healthy.  He presents for evaluation of chest tightness earlier tonight.  He said that he has been working very hard at 2 jobs and thinks that he is pushing himself way too hard.  He also does heavy manual labor, lifting numerous boxes throughout a shift.  He reports that 7 to 8 months ago he saw Dr. Ubaldo Glassing and had a cardiac stress test which was completely normal and he was told that similar symptoms he was having at the time were likely due to stress and anxiety.  He felt better for a while but he has had 2 episodes similar to tonight where he was having chest tightness that started with extreme exertion at work.  The symptoms continued until EMS arrived.  They gave him a spray of nitroglycerin and he said within about 15 minutes the chest pressure eased off.  He has been waiting about 6 hours in the emergency department and has had no repeat of his symptoms.  He said he feels much better and is ready to go home.  He was just concerned that it was something worse than before.  He denies any Covid contacts.  He denies sore throat, shortness of breath, cough, nausea, vomiting, diaphoresis, abdominal pain.  Exertion seems to make the symptoms worse but only with excessive and prolonged exertion.  He describes the symptoms as moderate to severe earlier and he is currently asymptomatic.   Of note, the symptoms started about 2 hours prior to his arrival in the ED.        Past Medical History:  Diagnosis Date  . GERD (gastroesophageal reflux disease)   . IBS (irritable bowel syndrome)      Patient Active Problem List   Diagnosis Date Noted  . Chest pain 12/08/2018  . GERD (gastroesophageal reflux disease) 12/08/2018    Past Surgical History:  Procedure Laterality Date  . BACK SURGERY      Prior to Admission medications   Medication Sig Start Date End Date Taking? Authorizing Provider  cetirizine (ZYRTEC) 10 MG tablet Take 1 tablet (10 mg total) by mouth daily. 08/20/18   Jearld Fenton, NP  fluticasone (FLONASE) 50 MCG/ACT nasal spray Place into both nostrils daily.    [provider]  gabapentin (NEURONTIN) 300 MG capsule Take 300 mg by mouth 2 (two) times daily.    [provider]  magnesium hydroxide (PHILLIPS CHEWS) 311 MG CHEW chewable tablet Chew 311 mg by mouth every 4 (four) hours as needed.    [provider]  meloxicam (MOBIC) 15 MG tablet Take 15 mg by mouth daily.    [provider]  omeprazole (PRILOSEC) 20 MG capsule Take 20 mg by mouth daily.    [provider]  promethazine-dextromethorphan (PROMETHAZINE-DM) 6.25-15 MG/5ML syrup Take 5 mLs by mouth 4 (four) times daily as needed for cough. 08/20/18   Jearld Fenton, NP    Allergies Patient has no known allergies.  Family History  Problem Relation Age of Onset  . Heart failure Mother   . Hypertension Mother   . Heart failure Father  Social History Social History   Tobacco Use  . Smoking status: Never Smoker  . Smokeless tobacco: Never Used  Substance Use Topics  . Alcohol use: No  . Drug use: No    Review of Systems Constitutional: No fever/chills Eyes: No visual changes. ENT: No sore throat. Cardiovascular: Denies chest pain. Respiratory: Denies shortness of breath. Gastrointestinal: No abdominal pain.  No nausea, no vomiting.  No diarrhea.  No constipation. Genitourinary: Negative for dysuria. Musculoskeletal: Negative for neck pain.  Negative for back pain. Integumentary: Negative for rash. Neurological: Negative for headaches,  focal weakness or numbness.   ____________________________________________   PHYSICAL EXAM:  VITAL SIGNS: ED Triage Vitals  Enc Vitals Group     BP 08/12/19 2141 125/73     Pulse Rate 08/12/19 2141 79     Resp 08/12/19 2141 17     Temp 08/12/19 2141 98.1 F (36.7 C)     Temp Source 08/12/19 2141 Oral     SpO2 08/12/19 2141 100 %     Weight 08/12/19 2145 78 kg (172 lb)     Height 08/12/19 2145 1.702 m (5\' 7" )     Head Circumference --      Peak Flow --      Pain Score 08/12/19 2142 4     Pain Loc --      Pain Edu? --      Excl. in GC? --     Constitutional: Alert and oriented.  Well-appearing and in no distress.  Healthy body habitus. Eyes: Conjunctivae are normal.  Head: Atraumatic. Nose: No congestion/rhinnorhea. Mouth/Throat: Patient is wearing a mask. Neck: No stridor.  No meningeal signs.   Cardiovascular: Normal rate, regular rhythm. Good peripheral circulation. Grossly normal heart sounds. Respiratory: Normal respiratory effort.  No retractions. Gastrointestinal: Soft and nontender. No distention.  Musculoskeletal: No lower extremity tenderness nor edema. No gross deformities of extremities. Neurologic:  Normal speech and language. No gross focal neurologic deficits are appreciated.  Skin:  Skin is warm, dry and intact. Psychiatric: Mood and affect are normal. Speech and behavior are normal.  ____________________________________________   LABS (all labs ordered are listed, but only abnormal results are displayed)  Labs Reviewed  BASIC METABOLIC PANEL - Abnormal; Notable for the following components:      Result Value   Potassium 3.4 (*)    CO2 20 (*)    All other components within normal limits  CBC - Abnormal; Notable for the following components:   Hemoglobin 12.6 (*)    HCT 36.4 (*)    All other components within normal limits  TROPONIN I (HIGH SENSITIVITY)  TROPONIN I (HIGH SENSITIVITY)   ____________________________________________  EKG  ED  ECG REPORT I, Loleta Roseory Caliann Leckrone, the attending physician, personally viewed and interpreted this ECG.  Date: 08/12/2019 EKG Time: 21: 28 Rate: 72 Rhythm: normal sinus rhythm QRS Axis: normal Intervals: normal ST/T Wave abnormalities: normal Narrative Interpretation: no evidence of acute ischemia  ____________________________________________  RADIOLOGY I, Loleta Roseory Geral Coker, personally viewed and evaluated these images (plain radiographs) as part of my medical decision making, as well as reviewing the written report by the radiologist.  ED MD interpretation: No acute abnormalities on chest x-ray  Official radiology report(s): Dg Chest 2 View  Result Date: 08/12/2019 CLINICAL DATA:  Chest pain EXAM: CHEST - 2 VIEW COMPARISON:  December 08, 2018 FINDINGS: The heart size and mediastinal contours are within normal limits. Both lungs are clear. The visualized skeletal structures are unremarkable. IMPRESSION: No active cardiopulmonary disease.  Electronically Signed   By: Jonna Clark M.D.   On: 08/12/2019 22:19    ____________________________________________   PROCEDURES   Procedure(s) performed (including Critical Care):  Procedures   ____________________________________________   INITIAL IMPRESSION / MDM / ASSESSMENT AND PLAN / ED COURSE  As part of my medical decision making, I reviewed the following data within the electronic MEDICAL RECORD NUMBER Nursing notes reviewed and incorporated, Labs reviewed , EKG interpreted , Old chart reviewed, Radiograph reviewed  and Notes from prior ED visits   Differential diagnosis includes, but is not limited to, angina, ACS, musculoskeletal pain, PE, stress/anxiety.  I suspect there is an anginal component of his symptoms but it is reassuring he had a recent cardiac work-up including a stress test that was unremarkable.  He is healthy and active and he said that he only has symptoms when he pushes himself to the extreme.  He has been asymptomatic for about 6  hours.  His symptoms started 2 hours prior to the arrival in the ED and he has a normal high-sensitivity troponin.  I discussed with him the possibility of repeating the test and explained that is the customary protocol, but he does not want to wait and just wants to go home and I think that is understandable and appropriate given his reassuring EKG, recent cardiac work-up, and normal high-sensitivity troponin that was obtained about 2 hours after the onset of his pain.  He has been asymptomatic for 6 hours.  I encouraged him to follow-up with Dr. Lady Gary and to take a daily baby aspirin.  He received a full dose aspirin by EMS prior to arrival to the ED tonight.  He is ambulatory and in no distress and I gave my usual and customary return precautions.          ____________________________________________  FINAL CLINICAL IMPRESSION(S) / ED DIAGNOSES  Final diagnoses:  Atypical chest pain     MEDICATIONS GIVEN DURING THIS VISIT:  Medications - No data to display   ED Discharge Orders    None      *Please note:  Calvin ROBISON Sr. was evaluated in Emergency Department on 08/13/2019 for the symptoms described in the history of present illness. He was evaluated in the context of the global COVID-19 pandemic, which necessitated consideration that the patient might be at risk for infection with the SARS-CoV-2 virus that causes COVID-19. Institutional protocols and algorithms that pertain to the evaluation of patients at risk for COVID-19 are in a state of rapid change based on information released by regulatory bodies including the CDC and federal and state organizations. These policies and algorithms were followed during the patient's care in the ED.  Some ED evaluations and interventions may be delayed as a result of limited staffing during the pandemic.*  Note:  This document was prepared using Dragon voice recognition software and may include unintentional dictation errors.   Loleta Rose,  MD 08/13/19 [phone]

## 2019-11-08 ENCOUNTER — Emergency Department
Admission: EM | Admit: 2019-11-08 | Discharge: 2019-11-08 | Disposition: A | Discharge status: Home / Self Care | Payer: Self-pay | Attending: Emergency Medicine | Admitting: Emergency Medicine

## 2019-11-08 ENCOUNTER — Encounter: Payer: Self-pay | Admitting: Emergency Medicine

## 2019-11-08 ENCOUNTER — Other Ambulatory Visit: Payer: Self-pay

## 2019-11-08 ENCOUNTER — Emergency Department: Payer: Self-pay

## 2019-11-08 DIAGNOSIS — M7051 Other bursitis of knee, right knee: Secondary | ICD-10-CM | POA: Insufficient documentation

## 2019-11-08 DIAGNOSIS — Z79899 Other long term (current) drug therapy: Secondary | ICD-10-CM | POA: Insufficient documentation

## 2019-11-08 DIAGNOSIS — Y939 Activity, unspecified: Secondary | ICD-10-CM | POA: Insufficient documentation

## 2019-11-08 MED ORDER — IBUPROFEN 600 MG PO TABS: 600.0000 mg | ORAL_TABLET | Freq: Four times a day (QID) | ORAL | 0 refills | Status: DC | PRN

## 2019-11-08 NOTE — ED Notes (Signed)
See triage note Presents with pain to right knee  States he noticed a swollen area below the knee cap about 4 days ago .denies any injury  Area tender to touch

## 2019-11-08 NOTE — ED Provider Notes (Signed)
[facility] Emergency Department Provider Note  ____________________________________________  Time seen: Approximately 4:36 PM  I have reviewed the triage vital signs and the nursing notes.   HISTORY  Chief Complaint Knee Pain    HPI Calvin JEFFRIES Sr. is a 49 y.o. male that presents to the emergency department for evaluation of right anterior knee pain for 4 days.  Patient states that he has some swelling just under his kneecap.  Area is painful.  No trauma.  Patient started working at [employer] 3 weeks ago.  Patient kneels on his knees regularly throughout the day.  Patient states that his right knee is really his "kneeling knee."  No lower leg swelling or pain.  No posterior knee pain or calf pain.  No wounds.  Fevers.  Past Medical History:  Diagnosis Date  . GERD (gastroesophageal reflux disease)   . IBS (irritable bowel syndrome)     Patient Active Problem List   Diagnosis Date Noted  . Chest pain 12/08/2018  . GERD (gastroesophageal reflux disease) 12/08/2018    Past Surgical History:  Procedure Laterality Date  . BACK SURGERY      Prior to Admission medications   Medication Sig Start Date End Date Taking? Authorizing Provider  cetirizine (ZYRTEC) 10 MG tablet Take 1 tablet (10 mg total) by mouth daily. 08/20/18   Jearld Fenton, NP  fluticasone (FLONASE) 50 MCG/ACT nasal spray Place into both nostrils daily.    [provider]  gabapentin (NEURONTIN) 300 MG capsule Take 300 mg by mouth 2 (two) times daily.    [provider]  ibuprofen (ADVIL) 600 MG tablet Take 1 tablet (600 mg total) by mouth every 6 (six) hours as needed. 11/08/19   Laban Emperor, PA-C  magnesium hydroxide (PHILLIPS CHEWS) 311 MG CHEW chewable tablet Chew 311 mg by mouth every 4 (four) hours as needed.    [provider]  omeprazole (PRILOSEC) 20 MG capsule Take 20 mg by mouth daily.    [provider]    Allergies Patient has no  known allergies.  Family History  Problem Relation Age of Onset  . Heart failure Mother   . Hypertension Mother   . Heart failure Father     Social History Social History   Tobacco Use  . Smoking status: Never Smoker  . Smokeless tobacco: Never Used  Substance Use Topics  . Alcohol use: No  . Drug use: No     Review of Systems  Constitutional: No fever/chills ENT: No upper respiratory complaints. Cardiovascular: No chest pain. Respiratory: No cough. No SOB. Gastrointestinal: No abdominal pain.  No nausea, no vomiting.  Musculoskeletal: Positive for knee pain. Skin: Negative for rash, abrasions, lacerations, ecchymosis. Neurological: Negative for headaches, numbness or tingling   ____________________________________________   PHYSICAL EXAM:  VITAL SIGNS: ED Triage Vitals  Enc Vitals Group     BP 11/08/19 1544 123/89     Pulse Rate 11/08/19 1544 76     Resp 11/08/19 1544 16     Temp 11/08/19 1544 98 F (36.7 C)     Temp Source 11/08/19 1544 Oral     SpO2 11/08/19 1544 98 %     Weight 11/08/19 1537 160 lb (72.6 kg)     Height 11/08/19 1537 5\' 7"  (1.702 m)     Head Circumference --      Peak Flow --      Pain Score 11/08/19 1537 7     Pain Loc --  Pain Edu? --      Excl. in GC? --      Constitutional: Alert and oriented. Well appearing and in no acute distress. Eyes: Conjunctivae are normal. PERRL. EOMI. Head: Atraumatic. ENT:      Ears:      Nose: No congestion/rhinnorhea.      Mouth/Throat: Mucous membranes are moist.  Neck: No stridor.   Cardiovascular: Normal rate, regular rhythm.  Good peripheral circulation.  Symmetric pedal pulses. Respiratory: Normal respiratory effort without tachypnea or retractions. Lungs CTAB. Good air entry to the bases with no decreased or absent breath sounds. Musculoskeletal: Full range of motion to all extremities. No gross deformities appreciated.  1/2 cm x 1/2 cm area of swelling to infrapatellar bursa.  Full  range of motion of knee.  No overlying erythema.  No gross swelling to right knee.  No popliteal tenderness.  No calf tenderness.  No ankle swelling. Neurologic:  Normal speech and language. No gross focal neurologic deficits are appreciated.  Skin:  Skin is warm, dry and intact. No rash noted. Psychiatric: Mood and affect are normal. Speech and behavior are normal. Patient exhibits appropriate insight and judgement.   ____________________________________________   LABS (all labs ordered are listed, but only abnormal results are displayed)  Labs Reviewed - No data to display ____________________________________________  EKG   ____________________________________________  RADIOLOGY Lexine Baton, personally viewed and evaluated these images (plain radiographs) as part of my medical decision making, as well as reviewing the written report by the radiologist.  DG Knee Complete 4 Views Right  Result Date: 11/08/2019 CLINICAL DATA:  Acute right knee pain and swelling. EXAM: RIGHT KNEE - COMPLETE 4+ VIEW COMPARISON:  None. FINDINGS: No evidence of fracture, dislocation, or joint effusion. No evidence of arthropathy or other focal bone abnormality. Soft tissues are unremarkable. IMPRESSION: Negative. Electronically Signed   By: Lupita Raider M.D.   On: 11/08/2019 16:19    ____________________________________________    PROCEDURES  Procedure(s) performed:    Procedures    Medications - No data to display   ____________________________________________   INITIAL IMPRESSION / ASSESSMENT AND PLAN / ED COURSE  Pertinent labs & imaging results that were available during my care of the patient were reviewed by me and considered in my medical decision making (see chart for details).  Review of the [place] CSRS was performed in accordance of the [facility] prior to dispensing any controlled drugs.   Patient's diagnosis is consistent with infrapatellar bursitis. Xray negative for acute  bony abnormalities. Exam is consistent with bursitis. Patient will try ace bandage, ice, elevation, NSAIDs initially. He will follow up with orthopedics for further management. Patient will be discharged home with prescriptions for ibuprofen. Patient is to follow up with orthopedics as directed. Patient is given ED precautions to return to the ED for any worsening or new symptoms.  [name] was evaluated in Emergency Department on 11/08/2019 for the symptoms described in the history of present illness. He was evaluated in the context of the global COVID-19 pandemic, which necessitated consideration that the patient might be at risk for infection with the SARS-CoV-2 virus that causes COVID-19. Institutional protocols and algorithms that pertain to the evaluation of patients at risk for COVID-19 are in a state of rapid change based on information released by regulatory bodies including the CDC and federal and state organizations. These policies and algorithms were followed during the patient's care in the ED.   ____________________________________________  FINAL CLINICAL IMPRESSION(S) /  ED DIAGNOSES  Final diagnoses:  Infrapatellar bursitis of right knee      NEW MEDICATIONS STARTED DURING THIS VISIT:  ED Discharge Orders         Ordered    ibuprofen (ADVIL) 600 MG tablet  Every 6 hours PRN     11/08/19 1656              This chart was dictated using voice recognition software/Dragon. Despite best efforts to proofread, errors can occur which can change the meaning. Any change was purely unintentional.    Enid Derry, PA-C 11/08/19 2222    Minna Antis, MD 11/08/19 [phone]

## 2019-11-08 NOTE — ED Triage Notes (Signed)
Patient presents to the ED with right knee pain.  Patient states pain started 4 days ago.  Patient states he has attempted to ice area with no relief.  Patient denies known injury.

## 2020-09-19 ENCOUNTER — Inpatient Hospital Stay
Admit: 2020-09-19 | Discharge: 2020-09-23 | Disposition: A | Discharge status: Other Acute Inpatient Hospital | Attending: Emergency Medicine

## 2020-09-19 DIAGNOSIS — R45851 Suicidal ideations: Secondary | ICD-10-CM

## 2020-09-19 LAB — CBC WITH AUTO DIFFERENTIAL
Basophils %: 1 % (ref 0–2)
Basophils Absolute: 0 10*3/uL (ref 0.0–0.1)
Eosinophils %: 1 % (ref 0–5)
Eosinophils Absolute: 0 10*3/uL (ref 0.0–0.4)
Granulocyte Absolute Count: 0 10*3/uL (ref 0.00–0.04)
Hematocrit: 41.4 % (ref 36.0–48.0)
Hemoglobin: 13.2 g/dL (ref 13.0–16.0)
Immature Granulocytes: 0 % (ref 0.0–0.5)
Lymphocytes %: 22 % (ref 21–52)
Lymphocytes Absolute: 1.9 10*3/uL (ref 0.9–3.6)
MCH: 28.6 PG (ref 24.0–34.0)
MCHC: 31.9 g/dL (ref 31.0–37.0)
MCV: 89.8 FL (ref 78.0–100.0)
MPV: 8.6 FL — ABNORMAL LOW (ref 9.2–11.8)
Monocytes %: 9 % (ref 3–10)
Monocytes Absolute: 0.8 10*3/uL (ref 0.05–1.2)
NRBC Absolute: 0 10*3/uL (ref 0.00–0.01)
Neutrophils %: 68 % (ref 40–73)
Neutrophils Absolute: 5.9 10*3/uL (ref 1.8–8.0)
Nucleated RBCs: 0 PER 100 WBC
Platelets: 324 10*3/uL (ref 135–420)
RBC: 4.61 M/uL (ref 4.35–5.65)
RDW: 13.8 % (ref 11.6–14.5)
WBC: 8.7 10*3/uL (ref 4.6–13.2)

## 2020-09-19 LAB — DRUG SCREEN, URINE
AMPHETAMINES: NEGATIVE
Amphetamine Screen, Urine: NEGATIVE
BARBITURATES: NEGATIVE
BENZODIAZEPINES: POSITIVE — AB
Barbiturate Screen, Urine: NEGATIVE
Benzodiazepine Screen, Urine: POSITIVE — AB
COCAINE: POSITIVE — AB
Cocaine Screen Urine: POSITIVE — AB
METHADONE: NEGATIVE
Methadone Screen, Urine: NEGATIVE
OPIATES: NEGATIVE
Opiate Screen, Urine: NEGATIVE
PCP Screen, Urine: NEGATIVE
PCP(PHENCYCLIDINE): NEGATIVE
THC (TH-CANNABINOL): NEGATIVE
THC Screen, Urine: NEGATIVE

## 2020-09-19 LAB — COMPREHENSIVE METABOLIC PANEL
ALT: 25 U/L (ref 16–61)
AST: 25 U/L (ref 10–38)
Albumin/Globulin Ratio: 0.7 — ABNORMAL LOW (ref 0.8–1.7)
Albumin: 3.6 g/dL (ref 3.4–5.0)
Alkaline Phosphatase: 75 U/L (ref 45–117)
Anion Gap: 5 mmol/L (ref 3.0–18)
BUN: 7 MG/DL (ref 7.0–18)
Bun/Cre Ratio: 11 — ABNORMAL LOW (ref 12–20)
CO2: 27 mmol/L (ref 21–32)
Calcium: 8.9 MG/DL (ref 8.5–10.1)
Chloride: 101 mmol/L (ref 100–111)
Creatinine: 0.64 MG/DL (ref 0.6–1.3)
EGFR IF NonAfrican American: >60 mL/min/{1.73_m2} (ref >60)
GFR African American: >60 mL/min/{1.73_m2} (ref >60)
Globulin: 4.9 g/dL — ABNORMAL HIGH (ref 2.0–4.0)
Glucose: 106 mg/dL — ABNORMAL HIGH (ref 74–99)
Potassium: 3.8 mmol/L (ref 3.5–5.5)
Sodium: 133 mmol/L — ABNORMAL LOW (ref 136–145)
Total Bilirubin: 0.2 MG/DL (ref 0.2–1.0)
Total Protein: 8.5 g/dL — ABNORMAL HIGH (ref 6.4–8.2)

## 2020-09-19 LAB — TROPONIN, HIGH SENSITIVITY: Troponin, High Sensitivity: 6 ng/L (ref 0–78)

## 2020-09-19 LAB — METABOLIC PANEL, COMPREHENSIVE
A-G Ratio: 0.7 — ABNORMAL LOW (ref 0.8–1.7)
ALT (SGPT): 25 U/L (ref 16–61)
AST (SGOT): 25 U/L (ref 10–38)
Albumin: 3.6 g/dL (ref 3.4–5.0)
Alk. phosphatase: 75 U/L (ref 45–117)
Anion gap: 5 mmol/L (ref 3.0–18)
BUN/Creatinine ratio: 11 — ABNORMAL LOW (ref 12–20)
BUN: 7 MG/DL (ref 7.0–18)
Bilirubin, total: 0.2 MG/DL (ref 0.2–1.0)
CO2: 27 mmol/L (ref 21–32)
Calcium: 8.9 MG/DL (ref 8.5–10.1)
Chloride: 101 mmol/L (ref 100–111)
Creatinine: 0.64 MG/DL (ref 0.6–1.3)
GFR est AA: >60 mL/min/{1.73_m2} (ref >60)
GFR est non-AA: >60 mL/min/{1.73_m2} (ref >60)
Globulin: 4.9 g/dL — ABNORMAL HIGH (ref 2.0–4.0)
Glucose: 106 mg/dL — ABNORMAL HIGH (ref 74–99)
Potassium: 3.8 mmol/L (ref 3.5–5.5)
Protein, total: 8.5 g/dL — ABNORMAL HIGH (ref 6.4–8.2)
Sodium: 133 mmol/L — ABNORMAL LOW (ref 136–145)

## 2020-09-19 LAB — CBC WITH AUTOMATED DIFF
ABS. BASOPHILS: 0 10*3/uL (ref 0.0–0.1)
ABS. EOSINOPHILS: 0 10*3/uL (ref 0.0–0.4)
ABS. IMM. GRANS.: 0 10*3/uL (ref 0.00–0.04)
ABS. LYMPHOCYTES: 1.9 10*3/uL (ref 0.9–3.6)
ABS. MONOCYTES: 0.8 10*3/uL (ref 0.05–1.2)
ABS. NEUTROPHILS: 5.9 10*3/uL (ref 1.8–8.0)
ABSOLUTE NRBC: 0 10*3/uL (ref 0.00–0.01)
BASOPHILS: 1 % (ref 0–2)
EOSINOPHILS: 1 % (ref 0–5)
HCT: 41.4 % (ref 36.0–48.0)
HGB: 13.2 g/dL (ref 13.0–16.0)
IMMATURE GRANULOCYTES: 0 % (ref 0.0–0.5)
LYMPHOCYTES: 22 % (ref 21–52)
MCH: 28.6 PG (ref 24.0–34.0)
MCHC: 31.9 g/dL (ref 31.0–37.0)
MCV: 89.8 FL (ref 78.0–100.0)
MONOCYTES: 9 % (ref 3–10)
MPV: 8.6 FL — ABNORMAL LOW (ref 9.2–11.8)
NEUTROPHILS: 68 % (ref 40–73)
NRBC: 0 PER 100 WBC
PLATELET: 324 10*3/uL (ref 135–420)
RBC: 4.61 M/uL (ref 4.35–5.65)
RDW: 13.8 % (ref 11.6–14.5)
WBC: 8.7 10*3/uL (ref 4.6–13.2)

## 2020-09-19 LAB — CKMB PROFILE
CK - MB: 2.9 ng/ml (ref <3.6)
CK-MB Index: 1.3 % (ref 0.0–4.0)
CK: 221 U/L (ref 39–308)

## 2020-09-19 LAB — TROPONIN-HIGH SENSITIVITY: Troponin-High Sensitivity: 6 ng/L (ref 0–78)

## 2020-09-19 MED ORDER — ACETAMINOPHEN 500 MG TAB
500 mg | ORAL | Status: AC
Start: 2020-09-19 — End: 2020-09-19
  Administered 2020-09-19: 20:00:00 via ORAL

## 2020-09-19 MED ORDER — LORAZEPAM 1 MG TAB
1 mg | Freq: Once | ORAL | Status: AC
Start: 2020-09-19 — End: 2020-09-19
  Administered 2020-09-19: 21:00:00 via ORAL

## 2020-09-19 MED FILL — ACETAMINOPHEN 500 MG TAB: 500 mg | ORAL | Qty: 2

## 2020-09-19 MED FILL — LORAZEPAM 1 MG TAB: 1 mg | ORAL | Qty: 1

## 2020-09-19 NOTE — ED Provider Notes (Signed)
EMERGENCY DEPARTMENT HISTORY AND PHYSICAL EXAM  This was created with voice recognition software and transcription errors may be present.     6:50 AM  Date: 09/19/2020  Patient Name: Marco Day    History of Presenting Illness     Chief Complaint:    History Provided By:     HPI: Marco Day is a 49 y.o. male past medical history noncontributory presents with depression.  Patient states he found a text from another gentleman on his girlfriend/fianc??'s phone.  No medical complaints at this time no aggravating alleviating factors no other associated symptoms    PCP: No primary care provider on file.      Past History     Past Medical History:  No past medical history on file.    Past Surgical History:  No past surgical history on file.    Family History:  No family history on file.    Social History:  Social History     Tobacco Use   ??? Smoking status: Not on file   ??? Smokeless tobacco: Not on file   Substance Use Topics   ??? Alcohol use: Not on file   ??? Drug use: Not on file       Allergies:  No Known Allergies    Review of Systems     Review of Systems   All other systems reviewed and are negative.    10 point review of systems otherwise negative unless noted in HPI.    Physical Exam       Physical Exam  Constitutional:       Appearance: He is well-developed.   HENT:      Head: Normocephalic and atraumatic.   Eyes:      Pupils: Pupils are equal, round, and reactive to light.   Cardiovascular:      Rate and Rhythm: Normal rate and regular rhythm.      Heart sounds: Normal heart sounds. No murmur heard.  No friction rub.   Pulmonary:      Effort: Pulmonary effort is normal. No respiratory distress.      Breath sounds: Normal breath sounds. No wheezing.   Abdominal:      General: There is no distension.      Palpations: Abdomen is soft.      Tenderness: There is no abdominal tenderness. There is no guarding or rebound.   Musculoskeletal:         General: Normal range of motion.      Cervical back: Normal range of  motion and neck supple.   Skin:     General: Skin is warm and dry.   Neurological:      Mental Status: He is alert and oriented to person, place, and time.   Psychiatric:         Behavior: Behavior normal.         Thought Content: Thought content normal.         Diagnostic Study Results     Vital Signs  EKG:  Labs:   Imaging:     Medical Decision Making     ED Course: Progress Notes, Reevaluation, and Consults:    I will be the provider of record for this patient.     Provider Notes (Medical Decision Making): Patient presents with suicidal ideation will check basic labs as per crisis eval         Diagnosis     Clinical Impression: No diagnosis found.    Disposition:  Patient's Medications    No medications on file

## 2020-09-19 NOTE — ED Notes (Signed)
Patient changing into paper scrubs

## 2020-09-19 NOTE — ED Notes (Signed)
Patient remains calm and cooperative, one on one sitter for direction observation at all times.

## 2020-09-19 NOTE — ED Notes (Signed)
Tray placed at bedside

## 2020-09-19 NOTE — ED Notes (Signed)
Report to Chris RN

## 2020-09-19 NOTE — ED Notes (Signed)
No distress noted, continuing to monitor patient one on one.

## 2020-09-19 NOTE — ED Notes (Signed)
No changes noted, continuing to monitor patient one on one.

## 2020-09-19 NOTE — ED Notes (Signed)
Sitter at bedside, pt sitting quietly. No needs identified

## 2020-09-19 NOTE — ED Notes (Signed)
Patient resting on stretcher, sitter remains at bedside for one on one observation.

## 2020-09-19 NOTE — ED Notes (Signed)
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 V/O from MD Daisy Blossom 1mg  PO ativan at this time

## 2020-09-19 NOTE — ED Notes (Signed)
Sandwich and drinks provided, sitter at bedside.
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
## 2020-09-20 MED ORDER — ACETAMINOPHEN 500 MG TAB
500 mg | ORAL | Status: AC
Start: 2020-09-20 — End: 2020-09-20
  Administered 2020-09-20: 15:00:00 via ORAL

## 2020-09-20 MED ORDER — LORAZEPAM 2 MG/ML IJ SOLN
2 mg/mL | INTRAMUSCULAR | Status: DC | PRN
Start: 2020-09-20 — End: 2020-09-22
  Administered 2020-09-21 – 2020-09-22 (×4): via INTRAVENOUS

## 2020-09-20 MED ORDER — SODIUM CHLORIDE 0.9 % IJ SYRG
Freq: Three times a day (TID) | INTRAMUSCULAR | Status: DC
Start: 2020-09-20 — End: 2020-09-20

## 2020-09-20 MED ORDER — LORAZEPAM 1 MG TAB
1 mg | ORAL | Status: DC | PRN
Start: 2020-09-20 — End: 2020-09-22
  Administered 2020-09-20 – 2020-09-22 (×3): via ORAL

## 2020-09-20 MED ORDER — LORAZEPAM 2 MG/ML IJ SOLN
2 mg/mL | INTRAMUSCULAR | Status: DC | PRN
Start: 2020-09-20 — End: 2020-09-22
  Administered 2020-09-21 (×4): via INTRAVENOUS

## 2020-09-20 MED ORDER — LORAZEPAM 1 MG TAB
1 mg | ORAL | Status: DC | PRN
Start: 2020-09-20 — End: 2020-09-22
  Administered 2020-09-20 – 2020-09-21 (×4): via ORAL

## 2020-09-20 MED ORDER — SODIUM CHLORIDE 0.9 % IJ SYRG
INTRAMUSCULAR | Status: DC | PRN
Start: 2020-09-20 — End: 2020-09-22

## 2020-09-20 MED ORDER — LORAZEPAM 2 MG/ML IJ SOLN
2 mg/mL | INTRAMUSCULAR | Status: DC | PRN
Start: 2020-09-20 — End: 2020-09-22

## 2020-09-20 MED FILL — ACETAMINOPHEN 500 MG TAB: 500 mg | ORAL | Qty: 2

## 2020-09-20 MED FILL — LORAZEPAM 1 MG TAB: 1 mg | ORAL | Qty: 2

## 2020-09-20 MED FILL — BD POSIFLUSH NORMAL SALINE 0.9 % INJECTION SYRINGE: INTRAMUSCULAR | Qty: 40

## 2020-09-20 MED FILL — LORAZEPAM 1 MG TAB: 1 mg | ORAL | Qty: 1

## 2020-09-20 NOTE — ED Notes (Signed)
Patient back in the ED. Sitter at bedside.

## 2020-09-20 NOTE — ED Notes (Signed)
7 AM patient turned over to me he is a 49 year old gentleman who I saw the other day currently pending placement for suicidal ideation currently sleeping with no complaints

## 2020-09-20 NOTE — ED Notes (Signed)
One on one direction observation still in process.   No changes noted, patient sleeping.

## 2020-09-20 NOTE — ED Notes (Signed)
Black suitcase placed into 7c

## 2020-09-20 NOTE — ED Notes (Signed)
Patient continues to sleep, one on one observation still on going.

## 2020-09-20 NOTE — ED Notes (Signed)
Continuously one on one monitoring still in process. Patient sleeping, no distress noted.

## 2020-09-20 NOTE — ED Notes (Signed)
Currently complaining of right leg pain, will ask ED MD for PRN acetaminophen

## 2020-09-20 NOTE — ED Notes (Signed)
49 year old male who was seen in the ER for suicidal ideations, was medically cleared, currently pending placement per crisis. No issues during my shift, patient is calm.

## 2020-09-20 NOTE — ED Notes (Signed)
 Voicemail left on Mr. Marco Day phone line stating that his son (the patient) was trying to reach him (unsuccessfully) from the bedside line. Stated pt is admitted, currently appearing well and in good spirits. Please give us  a call here at ED so that pt could chat with him as I fell this could potentially lift his spirits

## 2020-09-20 NOTE — ED Notes (Signed)
No changes noted, patient continues to sleep. One on one observation still on going.

## 2020-09-20 NOTE — ED Notes (Signed)
Pt on the phone with his father, nurse called from ED phone line.

## 2020-09-20 NOTE — ED Notes (Signed)
This nurse was informed that a patient was walking down the street in paper scrubs. Went outside to see where patient was, security was also standing outside watching patient as he was walking from the restaurant to the gas station. Patient did have on paper scrubs.

## 2020-09-20 NOTE — ED Notes (Signed)
No changes noted, Vital signs stable.

## 2020-09-20 NOTE — ED Notes (Signed)
No distress noted, continuing to monitor patient one on one.

## 2020-09-20 NOTE — ED Notes (Signed)
Report received from [place], [place]; pt laying quietly in the bed, sitter present at the bedside

## 2020-09-20 NOTE — ED Notes (Signed)
Quick CIWA was performed on my way to lunch, resource nurse medicated pt per CIWA protocol.

## 2020-09-20 NOTE — ED Notes (Signed)
Pt was in and out of sleep during medication administration; however, sitter and I both agreed that pt is visibly and vocally agitated while awake. Ativan administered per CIWA protocol.

## 2020-09-20 NOTE — ED Notes (Signed)
Spoke with pt and his father, pt would (officially) like to be seen/ treated at the [facility] hospital; I informed both that I am unsure of how this works. I will call crisis for guidance on this matter.

## 2020-09-20 NOTE — ED Notes (Signed)
An [employer] tablet with N8 found in pt bed. Revealed to be suboxone 8mg . Reported to EDMD's who agree that this is okay for pt to keep on his possession, as we do not want him to need It and not have access to. Placed in med cup and on pt bedside table

## 2020-09-20 NOTE — ED Notes (Signed)
Report given to Claire, rn

## 2020-09-20 NOTE — ED Notes (Signed)
No changes noted, continuing to monitor patient one on one.

## 2020-09-20 NOTE — ED Notes (Signed)
Pt asking to retrieve his suboxone from his bag; advised against this. Pt wanting to discharge and go to the [facility] hospital instead. Informed pt that I will speak with EDMD about both of these concerns.

## 2020-09-20 NOTE — ED Notes (Signed)
[facility] EMERGENCY DEPARTMENT HANDOFF      Patient was turned over to me at the regular change of shift by Dr. Charlett Nose, at 1400.  Patient presented with concerns about suicidal ideations alcohol withdrawal and substance abuse.      Plan for this patient is: Patient seen by crisis, pending disposition.  Patient wants to go back to [place] in the [place].    No results found for this or any previous visit (from the past 12 hour(s)).    No orders to display       Medications   sodium chloride (NS) flush 5-40 mL (has no administration in time range)   LORazepam (ATIVAN) tablet 1 mg (1 mg Oral Given 09/19/20 2059)     Or   LORazepam (ATIVAN) injection 1 mg ( IntraVENous See Alternative 09/19/20 2059)   LORazepam (ATIVAN) tablet 2 mg (2 mg Oral Given 09/20/20 0934)     Or   LORazepam (ATIVAN) injection 2 mg ( IntraVENous See Alternative 09/20/20 0934)   LORazepam (ATIVAN) injection 3 mg (has no administration in time range)   acetaminophen (TYLENOL) tablet 1,000 mg (1,000 mg Oral Given 09/19/20 1516)   LORazepam (ATIVAN) tablet 1 mg (1 mg Oral Given 09/19/20 1556)   acetaminophen (TYLENOL) tablet 1,000 mg (1,000 mg Oral Given 09/20/20 0934)         Course:   ED Course as of 09/20/20 2014   Sat Sep 20, 2020   1347 SI, seen by crisis already. Patient requesting to be discharged to go to . [JP]   1529 With Vikki Ports crisis, she is going to try to arrange the patient to go back to the [facility] and the patient also does not have a Covid test done so I will order that as well. [JP]   1947 Per Vikki Ports the plan will be to try to get the patient back to the [facility] hospital, hopefully tonight. [JP]   1958 Turned over to Dr. Rosette Reveal at the usual change of shift.  [JP]      ED Course User Index  [JP] Tomasita Crumble, DO       Diagnosis:   1. Suicidal ideations          Disposition: TBD    Follow-up Information    None         Patient's Medications    No medications on file
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
## 2020-09-20 NOTE — ED Notes (Signed)
Patient sleeping, no distress noted. Sitter remains at bedside for one on one observation.

## 2020-09-20 NOTE — ED Notes (Signed)
 CIWA currently 0 as pt is asleep

## 2020-09-20 NOTE — ED Notes (Signed)
 Patient was asked by RN if he still had suicidal thoughts and the patient said he did. He told the nurse I'm depressed because of the holidays and my wife cheated on me. Patient stated yeah, I'm suicidal.

## 2020-09-21 LAB — COVID-19, RAPID: SARS-CoV-2, Rapid: NOT DETECTED

## 2020-09-21 LAB — COVID-19 RAPID TEST: COVID-19 rapid test: NOT DETECTED

## 2020-09-21 MED ORDER — DIPHENHYDRAMINE 50 MG CAP
50 mg | ORAL | Status: AC
Start: 2020-09-21 — End: 2020-09-21
  Administered 2020-09-21: 17:00:00 via ORAL

## 2020-09-21 MED ORDER — LORAZEPAM 1 MG TAB
1 mg | Freq: Once | ORAL | Status: AC
Start: 2020-09-21 — End: 2020-09-21
  Administered 2020-09-21: 17:00:00 via ORAL

## 2020-09-21 MED ORDER — ACETAMINOPHEN 325 MG TABLET
325 mg | ORAL | Status: AC
Start: 2020-09-21 — End: 2020-09-21
  Administered 2020-09-21: 15:00:00 via ORAL

## 2020-09-21 MED ORDER — KETOROLAC TROMETHAMINE 30 MG/ML INJECTION
30 mg/mL (1 mL) | INTRAMUSCULAR | Status: AC
Start: 2020-09-21 — End: 2020-09-21
  Administered 2020-09-21: 16:00:00 via INTRAVENOUS

## 2020-09-21 MED FILL — LORAZEPAM 1 MG TAB: 1 mg | ORAL | Qty: 2

## 2020-09-21 MED FILL — DIPHENHYDRAMINE 50 MG CAP: 50 mg | ORAL | Qty: 1

## 2020-09-21 MED FILL — LORAZEPAM 2 MG/ML IJ SOLN: 2 mg/mL | INTRAMUSCULAR | Qty: 2

## 2020-09-21 MED FILL — MAPAP (ACETAMINOPHEN) 325 MG TABLET: 325 mg | ORAL | Qty: 3

## 2020-09-21 MED FILL — LORAZEPAM 2 MG/ML IJ SOLN: 2 mg/mL | INTRAMUSCULAR | Qty: 1

## 2020-09-21 MED FILL — KETOROLAC TROMETHAMINE 30 MG/ML INJECTION: 30 mg/mL (1 mL) | INTRAMUSCULAR | Qty: 1

## 2020-09-21 NOTE — Progress Notes (Signed)
Ketorolac 15 mg was therapeutically interchanged for Ketorolac 30 mg per the P &T Committee approved Therapeutic Interchanges Policy.

## 2020-09-21 NOTE — ED Notes (Signed)
Pt raising voice and swearing at sitter.

## 2020-09-21 NOTE — ED Notes (Signed)
Patient given warm blanket.

## 2020-09-21 NOTE — ED Notes (Signed)
Pt starts to be agitated with everybody in the ED insisting to have a phone call.  When he was not allowed to come near the nurse;s station he started to fight off and cursed out nurses

## 2020-09-21 NOTE — ED Notes (Signed)
CIWA 17, asked to hold ativan until evaluation with behavioral health and petition to magistrate is complete.

## 2020-09-21 NOTE — ED Notes (Signed)
Pt refusing to stay in room.  Pt standing in middle of hallway, stating that he needs to use the phone.  Informed pt that he needs to stay in his room.  Pt  Is becoming more aggressive with this nurse.  Informed pt that since he has made statements that he wants to kill himself, that he is unable to be discharged at this time.

## 2020-09-21 NOTE — ED Notes (Signed)
Tried calling pt's dad, no answer and left a voice message.    Informed physician

## 2020-09-21 NOTE — ED Notes (Signed)
Pt tried to pull out his IV line and as per Ja'Net RN, she saw a pink tablet on pt's bed (lisinopril)

## 2020-09-21 NOTE — ED Notes (Signed)
Patient very anxious sitting on stretcher. He continuously needs to be redirected about staying in his room. Patient wants to leave. Explained if he leaves I will have to call police and attempt to get a ECO on him.

## 2020-09-21 NOTE — ED Notes (Signed)
Pt still keeps on shaking, pacing so much, asked ed md for therapeutic advised.  Gave some tablets

## 2020-09-21 NOTE — ED Notes (Signed)
Pt asked to use the phone to call his dad.  Let him use one of the phone in the nurse's area

## 2020-09-21 NOTE — ED Notes (Signed)
Pt bit his tongue

## 2020-09-21 NOTE — ED Notes (Signed)
Report received from Lupita Leash, [place]. Assumption of care at this time. Sitter at bedside with patient.

## 2020-09-21 NOTE — ED Notes (Signed)
Pt has been extremely agitated towards everyone, even cussing his sitter for asking where he is going.  Pt also paces around trying to check on other pt's room.  Pt won't keep still in his bed

## 2020-09-21 NOTE — ED Notes (Signed)
PD at bedside.

## 2020-09-21 NOTE — ED Notes (Signed)
Pt is agitated and keeps on pacing around insisting to tylenol and to be discharged his reason was, it was almost Christmas and he needs to get his presents and he won't be able to take it if he is here

## 2020-09-21 NOTE — ED Notes (Signed)
Pt was able to sleep and relax

## 2020-09-21 NOTE — ED Notes (Signed)
Hold medication for now because pt has been quietly kying on his stretcher.

## 2020-09-21 NOTE — ED Notes (Signed)
Pt insisting to check on other pt's bed, asked him to stay on his bed.  Pt stated that he wants to die and kill himself while he was talking to his sitter.  Pt was agitated and keeps on pacing.  With stuttering when he talks.

## 2020-09-21 NOTE — ED Notes (Signed)
Patient is being uncooperative stating he has nothing to do. Patient given puzzles and to keep busy.

## 2020-09-21 NOTE — ED Notes (Signed)
Patient signed out to me.  Briefly, 49 year old male here for suicidal ideation.  Has been evaluated by crisis awaiting bed placement.  Is currently on CIWA protocol

## 2020-09-21 NOTE — ED Notes (Signed)
Pt wakes up agitated, asking for his clothes so he can go home

## 2020-09-21 NOTE — ED Notes (Signed)
Unable to take vital signs since pt has been agitated the whole day and when he calmed down, he was asleep

## 2020-09-21 NOTE — ED Notes (Signed)
Pt getting aggressive with staff.  Per Vikki Ports from Crisis, if he persists, we should call [facility] PD.

## 2020-09-21 NOTE — ED Notes (Signed)
Gave report to Serena RN

## 2020-09-21 NOTE — ED Notes (Signed)
 Heard pt stating I fucking want to hurt myself now, I just don't want to stay here while he was being assisted and sitter tried to pacify him.. Pt is pacing around so much and shaking

## 2020-09-21 NOTE — ED Notes (Signed)
Ativan was held as per dr. Jimmey Ralph due to him possibly being discharge

## 2020-09-21 NOTE — ED Notes (Signed)
Pt asked to use the phone to call his girlfriend.  Pt started to get agitated and sexual on the phone.  Asked him to stop and asked to go back to hisbed,  Pt went back then rushed back to this nurse asking why he can't continue with his call and if he can call his dad.  This RN didn't allow him to use the phone.  Pt is asking to go home.  Called security

## 2020-09-21 NOTE — ED Notes (Signed)
Pt is still asleep, with rise and fall of the abdomen noted

## 2020-09-21 NOTE — ED Notes (Signed)
Pt suddenly stand up and was asking to use the phone.

## 2020-09-21 NOTE — ED Notes (Signed)
Pt again asked to talked to his dad, let him use the nurse's stations phone again, Pt starts to be agitated and noticed that the phone screen is empty and no active calls

## 2020-09-21 NOTE — ED Notes (Signed)
Pt asked to speak with his dad, stating that he wants to go to [place] that his dad will bring him there.  Let him use the nure's station phone

## 2020-09-21 NOTE — ED Notes (Signed)
Patient resting on er stretcher with eyes closed, equal rise and fall of chest noted.

## 2020-09-22 MED ORDER — DIPHENHYDRAMINE 25 MG CAP
25 mg | ORAL | Status: AC
Start: 2020-09-22 — End: 2020-09-22
  Administered 2020-09-22: 15:00:00 via ORAL

## 2020-09-22 MED ORDER — HALOPERIDOL LACTATE 5 MG/ML IJ SOLN
5 mg/mL | INTRAMUSCULAR | Status: AC
Start: 2020-09-22 — End: 2020-09-22
  Administered 2020-09-22: 16:00:00 via INTRAMUSCULAR

## 2020-09-22 MED FILL — HALOPERIDOL LACTATE 5 MG/ML IJ SOLN: 5 mg/mL | INTRAMUSCULAR | Qty: 1

## 2020-09-22 MED FILL — LORAZEPAM 2 MG/ML IJ SOLN: 2 mg/mL | INTRAMUSCULAR | Qty: 2

## 2020-09-22 MED FILL — DIPHENHIST 25 MG CAPSULE: 25 mg | ORAL | Qty: 1

## 2020-09-22 MED FILL — LORAZEPAM 1 MG TAB: 1 mg | ORAL | Qty: 1

## 2020-09-22 NOTE — ED Notes (Signed)
Report given to Sue Lush, RN.  Opportunity for questions provided.

## 2020-09-22 NOTE — ED Notes (Signed)
Pt returned back to department, stating he just wanted to be outside, wanted a phone, wanted to smoke and was tired of waiting. Escorted back to F1. Constant observer at bedside. Pt re educated that he can't leave the dept, offered nicotine patch, hospital phone, both of which he declined. This Clinical research associate spoke with Maryla Morrow of [employer], who is aware pt returned to dept but was still not under an ECO/TDO. Per Ester, she will follow up on why TDO was not issued and notify the dept if one is issued from this point forward, and will continue with voluntary bed search in the interim. Elnita Maxwell charge RN, MD Reather Littler nurse director, and I. Bey head of security all aware.
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
## 2020-09-22 NOTE — ED Notes (Signed)
Report given to [facility], day shift RN.

## 2020-09-22 NOTE — ED Notes (Signed)
Patient turned over to me he is a 49 year old gentleman on a TDO pending placement Case was discussed with crisis he is going to [facility] accepting physician Lauraine Rinne to be transferred momentarily

## 2020-09-22 NOTE — ED Notes (Signed)
Patient is moved to F1 from ED bed 7. Safety intact. Patient remains in paper scrubs. He is calm and cooperative at present. He appears to fall asleep once in room. Eyes closed, respirations even and unlabored. 1:1 monitoring for safety continues.

## 2020-09-22 NOTE — ED Notes (Signed)
This Clinical research associate called [employer] dispatch [phone] and spoke with an officer Maye Hides re a TDO that was faxed to the emergency department. Dispatch aware that there is not a Emergency planning/management officer present in dept with pt to serve TDO. Dispatch to send an Technical sales engineer. MD Janey Greaser and Elnita Maxwell charge RN aware

## 2020-09-22 NOTE — ED Notes (Signed)
1:1 monitoring continues. Safety intact. No change in status.

## 2020-09-22 NOTE — ED Notes (Signed)
Received notice that pt became agitated and eloped from department despite a constant observer, nursing staff and security multiple attempts to redirect pt. This Clinical research associate called [employer] [phone] and spoke to dispatch Sharman Crate to report [facility] elopement, description given including pt was in blue paper scrubs and socks without any personal belongings per policy. [place] [facility] [phone] called and this Clinical research associate spoke to [employer]. Gina aware BH pt had eloped because he was not on an ECO/TDO, and she stated she would escalate appropriately. MD Starlyn Skeans and M.McAloon nurse director aware. I. Bey head of security also in environment to assist locating pt.

## 2020-09-22 NOTE — ED Notes (Signed)
The patient has come back to his room and a TDO has arrived from the magistrate.  The patient is asking for Haldol and he is pacing and having some evidence of early agitation so we will give him a dose of Haldol and continue to monitor the patient until taken to the next level of care.Myriam Jacobson, DO 9:53 AM

## 2020-09-22 NOTE — ED Notes (Signed)
SN called report to [facility] and spoke to [employer].

## 2020-09-22 NOTE — ED Notes (Signed)
Patient refused vital sign reassessment.

## 2021-01-22 ENCOUNTER — Encounter: Payer: Self-pay | Admitting: Emergency Medicine

## 2021-01-22 ENCOUNTER — Emergency Department
Admission: EM | Admit: 2021-01-22 | Discharge: 2021-01-22 | Disposition: A | Discharge status: Home / Self Care | Payer: Self-pay | Attending: Emergency Medicine | Admitting: Emergency Medicine

## 2021-01-22 ENCOUNTER — Other Ambulatory Visit: Payer: Self-pay

## 2021-01-22 DIAGNOSIS — M546 Pain in thoracic spine: Secondary | ICD-10-CM | POA: Insufficient documentation

## 2021-01-22 DIAGNOSIS — Y92410 Unspecified street and highway as the place of occurrence of the external cause: Secondary | ICD-10-CM | POA: Insufficient documentation

## 2021-01-22 DIAGNOSIS — Z5321 Procedure and treatment not carried out due to patient leaving prior to being seen by health care provider: Secondary | ICD-10-CM | POA: Insufficient documentation

## 2021-01-22 NOTE — ED Notes (Signed)
No answer when called several times from lobby; found empty w/c with ccollar sitting in chair; called number listed in chart with no answer as well

## 2021-01-22 NOTE — ED Notes (Signed)
No answer when called several times from lobby 
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
## 2021-07-02 ENCOUNTER — Emergency Department: Payer: Worker's Compensation

## 2021-07-02 ENCOUNTER — Other Ambulatory Visit: Payer: Self-pay

## 2021-07-02 ENCOUNTER — Emergency Department
Admission: EM | Admit: 2021-07-02 | Discharge: 2021-07-02 | Disposition: A | Discharge status: Home / Self Care | Payer: Worker's Compensation | Attending: Emergency Medicine | Admitting: Emergency Medicine

## 2021-07-02 DIAGNOSIS — S39012A Strain of muscle, fascia and tendon of lower back, initial encounter: Secondary | ICD-10-CM

## 2021-07-02 DIAGNOSIS — Y990 Civilian activity done for income or pay: Secondary | ICD-10-CM | POA: Insufficient documentation

## 2021-07-02 DIAGNOSIS — M5441 Lumbago with sciatica, right side: Secondary | ICD-10-CM | POA: Insufficient documentation

## 2021-07-02 DIAGNOSIS — X500XXA Overexertion from strenuous movement or load, initial encounter: Secondary | ICD-10-CM | POA: Insufficient documentation

## 2021-07-02 DIAGNOSIS — M5442 Lumbago with sciatica, left side: Secondary | ICD-10-CM | POA: Diagnosis not present

## 2021-07-02 DIAGNOSIS — M545 Low back pain, unspecified: Secondary | ICD-10-CM | POA: Diagnosis present

## 2021-07-02 MED ORDER — MORPHINE SULFATE (PF) 4 MG/ML IV SOLN
4.0000 mg | Freq: Once | INTRAVENOUS | Status: AC
  Administered 2021-07-02: 4 mg via INTRAVENOUS
  Filled 2021-07-02: qty 1

## 2021-07-02 MED ORDER — PREDNISONE 10 MG PO TABS: 10.0000 mg | ORAL_TABLET | ORAL | 0 refills | Status: DC

## 2021-07-02 MED ORDER — ONDANSETRON HCL 4 MG/2ML IJ SOLN
4.0000 mg | Freq: Once | INTRAMUSCULAR | Status: AC
  Administered 2021-07-02: 4 mg via INTRAVENOUS
  Filled 2021-07-02: qty 2

## 2021-07-02 MED ORDER — ORPHENADRINE CITRATE 30 MG/ML IJ SOLN
60.0000 mg | Freq: Once | INTRAMUSCULAR | Status: AC
  Administered 2021-07-02: 60 mg via INTRAVENOUS
  Filled 2021-07-02: qty 2

## 2021-07-02 MED ORDER — KETOROLAC TROMETHAMINE 30 MG/ML IJ SOLN
30.0000 mg | Freq: Once | INTRAMUSCULAR | Status: AC
  Administered 2021-07-02: 30 mg via INTRAVENOUS
  Filled 2021-07-02: qty 1

## 2021-07-02 MED ORDER — MELOXICAM 15 MG PO TABS: 15.0000 mg | ORAL_TABLET | Freq: Every day | ORAL | 0 refills | Status: DC

## 2021-07-02 MED ORDER — METHOCARBAMOL 500 MG PO TABS: 500.0000 mg | ORAL_TABLET | Freq: Four times a day (QID) | ORAL | 0 refills | Status: DC

## 2021-07-02 MED ORDER — DEXAMETHASONE SODIUM PHOSPHATE 10 MG/ML IJ SOLN
10.0000 mg | Freq: Once | INTRAMUSCULAR | Status: AC
  Administered 2021-07-02: 10 mg via INTRAVENOUS
  Filled 2021-07-02: qty 1

## 2021-07-02 MED ORDER — CYCLOBENZAPRINE HCL 10 MG PO TABS
10.0000 mg | ORAL_TABLET | Freq: Once | ORAL | Status: AC
  Administered 2021-07-02: 10 mg via ORAL
  Filled 2021-07-02: qty 1

## 2021-07-02 NOTE — ED Provider Notes (Signed)
[facility] Emergency Department Provider Note  ____________________________________________   Event Date/Time   First MD Initiated Contact with Patient 07/02/21 1355     (approximate)  I have reviewed the triage vital signs and the nursing notes.   HISTORY  Chief Complaint Back Pain    HPI Calvin Yoder. is a 50 y.o. male presents emergency department complaining of low back pain.  Patient has history of 2 back surgeries.  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  Is in severe pain right now.  States every time he tries to move the muscles in his legs that pain shoots down the legs.  No other injuries reported  Past Medical History:  Diagnosis Date   GERD (gastroesophageal reflux disease)    IBS (irritable bowel syndrome)     Patient Active Problem List   Diagnosis Date Noted   Chest pain 12/08/2018   GERD (gastroesophageal reflux disease) 12/08/2018    Past Surgical History:  Procedure Laterality Date   BACK SURGERY      Prior to Admission medications   Medication Sig Start Date End Date Taking? Authorizing Provider  cetirizine (ZYRTEC) 10 MG tablet Take 1 tablet (10 mg total) by mouth daily. 08/20/18   Lorre Munroe, NP  fluticasone (FLONASE) 50 MCG/ACT nasal spray Place into both nostrils daily.    [provider]  gabapentin (NEURONTIN) 300 MG capsule Take 300 mg by mouth 2 (two) times daily.    [provider]  ibuprofen (ADVIL) 600 MG tablet Take 1 tablet (600 mg total) by mouth every 6 (six) hours as needed. 11/08/19   Enid Derry, PA-C  magnesium hydroxide (PHILLIPS CHEWS) 311 MG CHEW chewable tablet Chew 311 mg by mouth every 4 (four) hours as needed.    [provider]  omeprazole (PRILOSEC) 20 MG capsule Take 20 mg by mouth daily.    [provider]    Allergies Patient has no known  allergies.  Family History  Problem Relation Age of Onset   Heart failure Mother    Hypertension Mother    Heart failure Father     Social History Social History   Tobacco Use   Smoking status: Never   Smokeless tobacco: Never  Vaping Use   Vaping Use: Never used  Substance Use Topics   Alcohol use: No   Drug use: No    Review of Systems  Constitutional: No fever/chills Eyes: No visual changes. ENT: No sore throat. Respiratory: Denies cough Cardiovascular: Denies chest pain Gastrointestinal: Denies abdominal pain Genitourinary: Negative for dysuria. Musculoskeletal: Positive for back pain. Skin: Negative for rash. Psychiatric: no mood changes,     ____________________________________________   PHYSICAL EXAM:  VITAL SIGNS: ED Triage Vitals  Enc Vitals Group     BP 07/02/21 1225 (!) 123/98     Pulse Rate 07/02/21 1225 91     Resp 07/02/21 1225 20     Temp 07/02/21 1225 98.7 F (37.1 C)     Temp Source 07/02/21 1225 Oral     SpO2 07/02/21 1225 98 %     Weight 07/02/21 1226 164 lb (74.4 kg)     Height 07/02/21 1226 5\' 7"  (1.702 m)     Head Circumference --      Peak Flow --      Pain Score 07/02/21 1232 10  Pain Loc --      Pain Edu? --      Excl. in GC? --     Constitutional: Alert and oriented. Well appearing and in no acute distress. Eyes: Conjunctivae are normal.  Head: Atraumatic. Nose: No congestion/rhinnorhea. Mouth/Throat: Mucous membranes are moist.   Neck:  supple no lymphadenopathy noted Cardiovascular: Normal rate, regular rhythm.  Respiratory: Normal respiratory effort.  No retractions,  GU: deferred Musculoskeletal: FROM all extremities, warm and well perfused, pain reproduced with movement of the legs, positive straight leg raise, lumbar spine tender to palpation, paravertebral muscles spasmed and tender, patient is very uncomfortable, neurovascular is intact, strength in toes is 5/5 bilaterally Neurologic:  Normal speech and  language.  Skin:  Skin is warm, dry and intact. No rash noted. Psychiatric: Mood and affect are normal. Speech and behavior are normal.  ____________________________________________   LABS (all labs ordered are listed, but only abnormal results are displayed)  Labs Reviewed - No data to display ____________________________________________   ____________________________________________  RADIOLOGY  X-ray lumbar spine MRI lumbar spine  ____________________________________________   PROCEDURES  Procedure(s) performed: No  Procedures    ____________________________________________   INITIAL IMPRESSION / ASSESSMENT AND PLAN / ED COURSE  Pertinent labs & imaging results that were available during my care of the patient were reviewed by me and considered in my medical decision making (see chart for details).   Patient is a 50 year old male presents with back injury.  See HPI.  Physical exam shows patient be stable  X-ray of the lumbar spine reviewed by me confirmed by radiology be negative for any acute abnormality  Patient was given morphine, Zofran and Flexeril.  He states that his taking the edge off but he still having a lot of pain when he tries to walk.  MRI of lumbar spine due to patient's multiple surgeries along with new injury   Care transferred to [facility], PA-C  Calvin Martyr Sr. was evaluated in Emergency Department on 07/02/2021 for the symptoms described in the history of present illness. He was evaluated in the context of the global COVID-19 pandemic, which necessitated consideration that the patient might be at risk for infection with the SARS-CoV-2 virus that causes COVID-19. Institutional protocols and algorithms that pertain to the evaluation of patients at risk for COVID-19 are in a state of rapid change based on information released by regulatory bodies including the CDC and federal and state organizations. These policies and algorithms were  followed during the patient's care in the ED.    As part of my medical decision making, I reviewed the following data within the electronic MEDICAL RECORD NUMBER Nursing notes reviewed and incorporated, Old chart reviewed, Radiograph reviewed , Notes from prior ED visits, and [place] Controlled Substance Database  ____________________________________________   FINAL CLINICAL IMPRESSION(S) / ED DIAGNOSES  Final diagnoses:  Acute midline low back pain with bilateral sciatica      NEW MEDICATIONS STARTED DURING THIS VISIT:  New Prescriptions   No medications on file     Note:  This document was prepared using Dragon voice recognition software and may include unintentional dictation errors.    Faythe Ghee, PA-C 07/02/21 1549    Sharman Cheek, MD 07/04/21 [phone]

## 2021-07-02 NOTE — ED Triage Notes (Signed)
Pt here with back pain after lifting something heavy at work. Pt moaning in triage due to pain. A&O x4. Works at [employer] [address].

## 2021-07-02 NOTE — ED Provider Notes (Addendum)
HPI: Pt is a 50 y.o. male who presents with complaints of back pain. The patient p/w  lower back pain after lifting something at work.  No numbness in back. The pain shooting down the left leg. H/o herniated disc. 2001 and 2005.  PT reporting not able to walk well.  No numbness or new weakness in leg. And no urinating on yourself.   ROS: Denies fever, chest pain, vomiting  Past Medical History:  Diagnosis Date   GERD (gastroesophageal reflux disease)    IBS (irritable bowel syndrome)    There were no vitals filed for this visit.  Focused Physical Exam: Gen: No acute distress Head: atraumatic, normocephalic Eyes: Extraocular movements grossly intact; conjunctiva clear CV: RRR Lung: No increased WOB, no stridor GI: ND, no obvious masses Neuro: Alert and awake  Medical Decision Making and Plan: Given the patient's initial medical screening exam, the following diagnostic evaluation has been ordered. The patient will be placed in the appropriate treatment space, once one is available, to complete the evaluation and treatment. I have discussed the plan of care with the patient and I have advised the patient that an ED physician or mid-level practitioner will reevaluate their condition after the test results have been received, as the results may give them additional insight into the type of treatment they may need.   Diagnostics:  will need room and then further evaluation.   Treatments: will need pain meds in room and then decision for further imaging if necessary   Concha Se, MD 07/02/21 1226    Concha Se, MD 07/02/21 1228

## 2021-07-02 NOTE — ED Provider Notes (Signed)
-----------------------------------------   5:54 PM on 07/02/2021 -----------------------------------------  Blood pressure (!) 123/98, pulse 91, temperature 98.7 F (37.1 C), temperature source Oral, resp. rate 20, height 5\' 7"  (1.702 m), weight 74.4 kg, SpO2 98 %.  Assuming care from , PA-C.  In short, Calvin Yoder. is a 50 y.o. male with a chief complaint of Back Pain .  Refer to the original H&P for additional details.  The current plan of care is to await MRI.  Patient presented to the emergency department after trying to lift a heavy item weighing roughly 700 pounds at work.  Patient has a history of back problems, has had 2 surgeries in the past on his spine.  Patient was complaining of excruciating pain and could not initially walk.  Patient states that after the initial round of medication he has been up, moving around trying to keep loose.  This has improved his back pain, states that he feels like he can walk at this time.  Patient is standing upright and walking while I am talking to them.  MRI returned without any acute traumatic findings to include compression fracture or herniated disc.  Patient does have some degenerative changes with compression in the foraminal region.  Patient has no neurodeficits at this time with loss of sensation, urinary or bowel changes.  Patient will have Toradol, Decadron, Norflex here in the emergency department.  Patient was adamant that he did not want a prescription for any narcotic.  As such we will provide prescription for prednisone taper, anti-inflammatory and muscle relaxer.  Follow-up with neurosurgery.  Patient will have restrictions at work.   ED diagnosis:  Lumbar strain.    44, PA-C 07/02/21 1802    07/04/21, MD 07/02/21 2100

## 2022-06-15 ENCOUNTER — Other Ambulatory Visit: Payer: Self-pay

## 2022-06-15 ENCOUNTER — Emergency Department
Admission: EM | Admit: 2022-06-15 | Discharge: 2022-06-15 | Disposition: A | Discharge status: Home / Self Care | Payer: 59 | Attending: Emergency Medicine | Admitting: Emergency Medicine

## 2022-06-15 DIAGNOSIS — M549 Dorsalgia, unspecified: Secondary | ICD-10-CM | POA: Diagnosis not present

## 2022-06-15 DIAGNOSIS — X500XXA Overexertion from strenuous movement or load, initial encounter: Secondary | ICD-10-CM | POA: Insufficient documentation

## 2022-06-15 DIAGNOSIS — I10 Essential (primary) hypertension: Secondary | ICD-10-CM | POA: Diagnosis not present

## 2022-06-15 DIAGNOSIS — Z981 Arthrodesis status: Secondary | ICD-10-CM

## 2022-06-15 DIAGNOSIS — M4326 Fusion of spine, lumbar region: Secondary | ICD-10-CM | POA: Insufficient documentation

## 2022-06-15 DIAGNOSIS — R000 Tachycardia, unspecified: Secondary | ICD-10-CM | POA: Diagnosis not present

## 2022-06-15 DIAGNOSIS — S3992XA Unspecified injury of lower back, initial encounter: Secondary | ICD-10-CM | POA: Diagnosis not present

## 2022-06-15 DIAGNOSIS — S39012A Strain of muscle, fascia and tendon of lower back, initial encounter: Secondary | ICD-10-CM | POA: Insufficient documentation

## 2022-06-15 MED ORDER — LIDOCAINE 5 % EX PTCH: 1.0000 | MEDICATED_PATCH | Freq: Two times a day (BID) | CUTANEOUS | 0 refills | Status: DC

## 2022-06-15 MED ORDER — LIDOCAINE 5 % EX PTCH
1.0000 | MEDICATED_PATCH | CUTANEOUS | Status: DC
  Administered 2022-06-15: 1 via TRANSDERMAL
  Filled 2022-06-15: qty 1

## 2022-06-15 MED ORDER — CYCLOBENZAPRINE HCL 10 MG PO TABS
5.0000 mg | ORAL_TABLET | Freq: Once | ORAL | Status: AC
  Administered 2022-06-15: 5 mg via ORAL
  Filled 2022-06-15: qty 1

## 2022-06-15 MED ORDER — CYCLOBENZAPRINE HCL 5 MG PO TABS: 5.0000 mg | ORAL_TABLET | Freq: Three times a day (TID) | ORAL | 0 refills | Status: DC | PRN

## 2022-06-15 MED ORDER — KETOROLAC TROMETHAMINE 10 MG PO TABS: 10.0000 mg | ORAL_TABLET | Freq: Four times a day (QID) | ORAL | 0 refills | Status: DC | PRN

## 2022-06-15 MED ORDER — KETOROLAC TROMETHAMINE 30 MG/ML IJ SOLN
30.0000 mg | Freq: Once | INTRAMUSCULAR | Status: AC
  Administered 2022-06-15: 30 mg via INTRAMUSCULAR
  Filled 2022-06-15: qty 1

## 2022-06-15 NOTE — ED Provider Notes (Signed)
[facility] Provider Note    Event Date/Time   First MD Initiated Contact with Patient 06/15/22 2000     (approximate)   History   Chief Complaint Back Pain  HPI  Calvin Yoder. is a 51 y.o. male with past medical history of GERD, IBS, and spinal fusion who presents to the ED complaining of back pain.  Patient reports that he had surgery on his back in 2001 and 2005, has dealt with occasional flareups of pain since then.  He is currently working at [employer] and states he has to lift heavy boxes frequently, has noted increasing pain in his back over the past 24 hours after doing some heavy lifting.  He denies any falls or other trauma to his back.  Pain became more severe earlier this evening with what feels like spasms in his lower back.  Pain is exacerbated when he tries to stand or walk, but he denies any numbness or weakness in his legs.  He has not had any saddle anesthesia and denies any bowel or bladder incontinence.  He has not taken anything for his pain prior to arrival.     Physical Exam   Triage Vital Signs: ED Triage Vitals  Enc Vitals Group     BP 06/15/22 2008 (!) 152/90     Pulse Rate 06/15/22 2008 75     Resp 06/15/22 2008 19     Temp 06/15/22 2008 99 F (37.2 C)     Temp Source 06/15/22 2008 Oral     SpO2 06/15/22 2008 100 %     Weight 06/15/22 2002 165 lb (74.8 kg)     Height 06/15/22 2002 5\' 7"  (1.702 m)     Head Circumference --      Peak Flow --      Pain Score 06/15/22 2008 10     Pain Loc --      Pain Edu? --      Excl. in GC? --     Most recent vital signs: Vitals:   06/15/22 2008  BP: (!) 152/90  Pulse: 75  Resp: 19  Temp: 99 F (37.2 C)  SpO2: 100%    Constitutional: Alert and oriented. Eyes: Conjunctivae are normal. Head: Atraumatic. Nose: No congestion/rhinnorhea. Mouth/Throat: Mucous membranes are moist.  Cardiovascular: Normal rate, regular rhythm. Grossly normal heart sounds.  2+ radial and DP  pulses bilaterally. Respiratory: Normal respiratory effort.  No retractions. Lungs CTAB. Gastrointestinal: Soft and nontender. No distention. Musculoskeletal: No lower extremity tenderness nor edema.  Midline lumbar spinal tenderness to palpation noted. Neurologic:  Normal speech and language. No gross focal neurologic deficits are appreciated.    ED Results / Procedures / Treatments   Labs (all labs ordered are listed, but only abnormal results are displayed) Labs Reviewed - No data to display   PROCEDURES:  Critical Care performed: No  Procedures   MEDICATIONS ORDERED IN ED: Medications  lidocaine (LIDODERM) 5 % 1 patch (1 patch Transdermal Patch Applied 06/15/22 2047)  ketorolac (TORADOL) 30 MG/ML injection 30 mg (30 mg Intramuscular Given 06/15/22 2048)  cyclobenzaprine (FLEXERIL) tablet 5 mg (5 mg Oral Given 06/15/22 2041)     IMPRESSION / MDM / ASSESSMENT AND PLAN / ED COURSE  I reviewed the triage vital signs and the nursing notes.                              51  y.o. male with past medical history of GERD, IBS, and spinal fusion who presents to the ED with acute on chronic midline low back pain worsening over the past 24 hours with associated muscle spasm.  Patient's presentation is most consistent with acute, uncomplicated illness.  Differential diagnosis includes, but is not limited to, lumbar strain, lumbar radiculopathy, cauda equina.  Patient nontoxic-appearing and in no acute distress, vital signs are unremarkable.  He is neurovascularly intact to his bilateral lower extremities, no findings to suggest cauda equina.  Given lack of traumatic injury, we will hold off on imaging, symptoms seem most consistent with lumbar strain.  We will treat symptomatically with IM Toradol, Lidoderm patch, and dose of Flexeril.  Patient reports feeling better following Toradol, Lidoderm, and Flexeril.  He is able to ambulate to and from the bathroom with minimal difficulty and is  appropriate for discharge home with outpatient follow-up.  He has been previously seen by neurosurgery here at the [facility] clinic, was counseled to follow-up with them as well as to establish care with PCP for potential physical therapy.  He will be prescribed additional Toradol, Lidoderm, and Flexeril.  He was counseled to return to the ED for new or worsening symptoms, patient agrees with plan.      FINAL CLINICAL IMPRESSION(S) / ED DIAGNOSES   Final diagnoses:  Strain of lumbar region, initial encounter  S/P lumbar fusion     Rx / DC Orders   ED Discharge Orders          Ordered    ketorolac (TORADOL) 10 MG tablet  Every 6 hours PRN        06/15/22 2214    lidocaine (LIDODERM) 5 %  Every 12 hours        06/15/22 2214    cyclobenzaprine (FLEXERIL) 5 MG tablet  3 times daily PRN        06/15/22 2214             Note:  This document was prepared using Dragon voice recognition software and may include unintentional dictation errors.   Chesley Noon, MD 06/15/22 2235

## 2022-06-15 NOTE — ED Triage Notes (Signed)
Pt to room 42 via [facility] with c/o back pain. 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  Denies urinary sx. Hx of back surgery

## 2022-06-17 ENCOUNTER — Emergency Department
Admission: EM | Admit: 2022-06-17 | Discharge: 2022-06-17 | Disposition: A | Discharge status: Home / Self Care | Payer: 59 | Attending: Emergency Medicine | Admitting: Emergency Medicine

## 2022-06-17 ENCOUNTER — Telehealth: Payer: Self-pay

## 2022-06-17 ENCOUNTER — Other Ambulatory Visit: Payer: Self-pay

## 2022-06-17 ENCOUNTER — Emergency Department: Payer: 59

## 2022-06-17 DIAGNOSIS — X58XXXA Exposure to other specified factors, initial encounter: Secondary | ICD-10-CM | POA: Diagnosis not present

## 2022-06-17 DIAGNOSIS — S3992XA Unspecified injury of lower back, initial encounter: Secondary | ICD-10-CM | POA: Diagnosis not present

## 2022-06-17 DIAGNOSIS — S39012A Strain of muscle, fascia and tendon of lower back, initial encounter: Secondary | ICD-10-CM | POA: Diagnosis not present

## 2022-06-17 DIAGNOSIS — R1030 Lower abdominal pain, unspecified: Secondary | ICD-10-CM | POA: Insufficient documentation

## 2022-06-17 DIAGNOSIS — R109 Unspecified abdominal pain: Secondary | ICD-10-CM | POA: Diagnosis not present

## 2022-06-17 LAB — CBC WITH DIFFERENTIAL/PLATELET
Abs Immature Granulocytes: 0.04 10*3/uL (ref 0.00–0.07)
Basophils Absolute: 0.1 10*3/uL (ref 0.0–0.1)
Basophils Relative: 1 %
Eosinophils Absolute: 0.1 10*3/uL (ref 0.0–0.5)
Eosinophils Relative: 1 %
HCT: 41 % (ref 39.0–52.0)
Hemoglobin: 13.6 g/dL (ref 13.0–17.0)
Immature Granulocytes: 1 %
Lymphocytes Relative: 27 %
Lymphs Abs: 2 10*3/uL (ref 0.7–4.0)
MCH: 27.6 pg (ref 26.0–34.0)
MCHC: 33.2 g/dL (ref 30.0–36.0)
MCV: 83.3 fL (ref 80.0–100.0)
Monocytes Absolute: 0.7 10*3/uL (ref 0.1–1.0)
Monocytes Relative: 10 %
Neutro Abs: 4.4 10*3/uL (ref 1.7–7.7)
Neutrophils Relative %: 60 %
Platelets: 256 10*3/uL (ref 150–400)
RBC: 4.92 MIL/uL (ref 4.22–5.81)
RDW: 13.4 % (ref 11.5–15.5)
WBC: 7.4 10*3/uL (ref 4.0–10.5)
nRBC: 0 % (ref 0.0–0.2)

## 2022-06-17 LAB — COMPREHENSIVE METABOLIC PANEL
ALT: 61 U/L — ABNORMAL HIGH (ref 0–44)
AST: 39 U/L (ref 15–41)
Albumin: 4.3 g/dL (ref 3.5–5.0)
Alkaline Phosphatase: 55 U/L (ref 38–126)
Anion gap: 6 (ref 5–15)
BUN: 16 mg/dL (ref 6–20)
CO2: 27 mmol/L (ref 22–32)
Calcium: 8.8 mg/dL — ABNORMAL LOW (ref 8.9–10.3)
Chloride: 109 mmol/L (ref 98–111)
Creatinine, Ser: 1.09 mg/dL (ref 0.61–1.24)
GFR, Estimated: >60 mL/min (ref >60)
Glucose, Bld: 72 mg/dL (ref 70–99)
Potassium: 3.9 mmol/L (ref 3.5–5.1)
Sodium: 142 mmol/L (ref 135–145)
Total Bilirubin: 0.7 mg/dL (ref 0.3–1.2)
Total Protein: 7.8 g/dL (ref 6.5–8.1)

## 2022-06-17 LAB — URINALYSIS, ROUTINE W REFLEX MICROSCOPIC
Bilirubin Urine: NEGATIVE
Glucose, UA: NEGATIVE mg/dL
Hgb urine dipstick: NEGATIVE
Ketones, ur: NEGATIVE mg/dL
Leukocytes,Ua: NEGATIVE
Nitrite: NEGATIVE
Protein, ur: NEGATIVE mg/dL
Specific Gravity, Urine: 1.017 (ref 1.005–1.030)
pH: 7 (ref 5.0–8.0)

## 2022-06-17 LAB — CHLAMYDIA/NGC RT PCR (ARMC ONLY)
Chlamydia Tr: NOT DETECTED
N gonorrhoeae: NOT DETECTED

## 2022-06-17 LAB — LIPASE, BLOOD: Lipase: 42 U/L (ref 11–51)

## 2022-06-17 MED ORDER — PREDNISONE 10 MG (21) PO TBPK: ORAL_TABLET | ORAL | 0 refills | Status: DC

## 2022-06-17 MED ORDER — IOHEXOL 300 MG/ML  SOLN
100.0000 mL | Freq: Once | INTRAMUSCULAR | Status: AC | PRN
  Administered 2022-06-17: 100 mL via INTRAVENOUS

## 2022-06-17 MED ORDER — ONDANSETRON HCL 4 MG/2ML IJ SOLN
4.0000 mg | Freq: Once | INTRAMUSCULAR | Status: AC
  Administered 2022-06-17: 4 mg via INTRAVENOUS
  Filled 2022-06-17: qty 2

## 2022-06-17 MED ORDER — HYDROMORPHONE HCL 1 MG/ML IJ SOLN
0.5000 mg | Freq: Once | INTRAMUSCULAR | Status: AC
  Administered 2022-06-17: 0.5 mg via INTRAVENOUS
  Filled 2022-06-17: qty 0.5

## 2022-06-17 NOTE — Discharge Instructions (Signed)
Return to the ER if you develop new numbness, pain or pooping on yourself or any other concerns otherwise you should call either orthopedic doctor or neurosurgery doctor to follow-up with.

## 2022-06-17 NOTE — Telephone Encounter (Signed)
-----   Message from Rockey Situ sent at 06/17/2022  4:10 PM EDT ----- Regarding: ER today Contact: [phone] Patient seen today in the ER for lumbar radiculopathy and on 06/15/2022. He was told there was nothing that they could do for him, he just needs to see Dr.Yarbrough. Last MRI was 06/2021 he also saw Danielle around that time. He said that he has not had injections. PT was last year. He is in constant pain but at this moment he was doing better since they gave him an injection. Can he see Dr.Yarbrough next week? Previous back surgery.

## 2022-06-17 NOTE — ED Triage Notes (Signed)
Pt states he was seen 2 days ago for back pain and d/c'ed- pt states now the pain is in his R groin area- pt states he did try the pills he was given last night with no relief

## 2022-06-17 NOTE — ED Provider Notes (Signed)
[facility] Provider Note    Event Date/Time   First MD Initiated Contact with Patient 06/17/22 1224     (approximate)   History   Groin Pain   HPI  Calvin Kauth. is a 51 y.o. male  male with GERD, IBS, spinal fusion who comes in with concerns for right back pain.  Patient reports the pain is now radiating into the lower abdomen.  He reports that the pain occasionally radiates into the groin area but he denies any actual testicle pain.  Denies any known swelling.  He has not had any known fevers.  He reports taking the medications that were prescribed to him when he was here 2 days ago without any relief in symptoms.  Patient denies any weakness in the legs, numbness in the legs.  He reports being here a year before with similar symptoms but at that time when he had given a work note for a few days he did get better but this time he reports the medications that were given to him 2 days ago he still having the pain and he is worried that it could be something else other than his back.     Physical Exam   Triage Vital Signs: ED Triage Vitals  Enc Vitals Group     BP 06/17/22 1138 (!) 148/83     Pulse Rate 06/17/22 1138 93     Resp 06/17/22 1138 20     Temp 06/17/22 1138 98.5 F (36.9 C)     Temp Source 06/17/22 1138 Oral     SpO2 06/17/22 1138 97 %     Weight 06/17/22 1138 165 lb (74.8 kg)     Height 06/17/22 1138 5\' 7"  (1.702 m)     Head Circumference --      Peak Flow --      Pain Score 06/17/22 1137 8     Pain Loc --      Pain Edu? --      Excl. in GC? --     Most recent vital signs: Vitals:   06/17/22 1138 06/17/22 1350  BP: (!) 148/83 (!) 140/80  Pulse: 93 88  Resp: 20 18  Temp: 98.5 F (36.9 C)   SpO2: 97% 98%     General: Awake, no distress.  CV:  Good peripheral perfusion.  Resp:  Normal effort.  Abd:  No distention.  Other:  Patient has right flank tenderness with a little bit of right lower quadrant tenderness.  Testicle  exams were is normal.  No erythema or pain noted   ED Results / Procedures / Treatments   Labs (all labs ordered are listed, but only abnormal results are displayed) Labs Reviewed  COMPREHENSIVE METABOLIC PANEL - Abnormal; Notable for the following components:      Result Value   Calcium 8.8 (*)    ALT 61 (*)    All other components within normal limits  URINALYSIS, ROUTINE W REFLEX MICROSCOPIC - Abnormal; Notable for the following components:   Color, Urine YELLOW (*)    APPearance HAZY (*)    All other components within normal limits  CHLAMYDIA/NGC RT PCR ([facility] ONLY)            CBC WITH DIFFERENTIAL/PLATELET  LIPASE, BLOOD     RADIOLOGY I have reviewed the CT personally interpreted and no evidence of appendicitis  Incidental findings provided a copy of report to patient   PROCEDURES:  Critical Care performed: No  Procedures  MEDICATIONS ORDERED IN ED: Medications  HYDROmorphone (DILAUDID) injection 0.5 mg (0.5 mg Intravenous Given 06/17/22 1245)  ondansetron (ZOFRAN) injection 4 mg (4 mg Intravenous Given 06/17/22 1244)  iohexol (OMNIPAQUE) 300 MG/ML solution 100 mL (100 mLs Intravenous Contrast Given 06/17/22 1350)     IMPRESSION / MDM / ASSESSMENT AND PLAN / ED COURSE  I reviewed the triage vital signs and the nursing notes.   Patient's presentation is most consistent with acute presentation with potential threat to life or bodily function.   Differential includes aneurysm, appendicitis, kidney stone, musculoskeletal back pain, stenosis, herniated disc.  The triage note stated that patient had testicle pain but upon examination he has no testicle pain and no swelling noted.  He reports its pain mostly in his back radiating into his upper abdomen.  CT imaging obtained and was reassuring.  We discussed again if he had any testicle pain and he denies therefore we discussed ultrasound but of opted to hold off since his pain is all in his upper back.  I reviewed  patient's prior MRI from a year ago where he has stenosis and some degenerative changes.  He denies any symptoms to suggest cord compression do not feel we need to do a repeat MRI today.  We discussed a steroid taper to try to help with symptoms and following up with neurosurgery orthopedics.  Also provided a work note for a few days.   His STD testing is negative.  CBC normal.  CMP reassuring slightly elevated ALT similar to prior.  Urine without evidence of UTI.  I do not feel the patient has cord compression he is got no evidence of distention on his CT imaging and denies any symptoms to suggest cord compression.  Patient has been able to get up and ambulate in the room.  Patient feels comfortable with discharge home and understands return precautions    FINAL CLINICAL IMPRESSION(S) / ED DIAGNOSES   Final diagnoses:  Strain of lumbar region, initial encounter     Rx / DC Orders   ED Discharge Orders          Ordered    predniSONE (STERAPRED UNI-PAK 21 TAB) 10 MG (21) TBPK tablet        06/17/22 1544             Note:  This document was prepared using Dragon voice recognition software and may include unintentional dictation errors.   Concha Se, MD 06/17/22 1544

## 2022-06-18 NOTE — Telephone Encounter (Signed)
He confirmed appt for Tuesday at 8:30am.

## 2022-06-21 NOTE — Progress Notes (Unsigned)
Referring Physician:  Concha Se, MD [address] [place],  [place] [address]  Primary Physician:  Pcp, No  History of Present Illness: 06/21/2022 Calvin Yoder is here today with a chief complaint of low back pain with radiation into the bilateral legs.  He has been having pain for many years but it has been slowly worsening over time and is now impacting his ability to work.  He has trouble standing and walking.  He reports that he has trouble getting going after standing from a sitting position.  His pain is currently 3 out of 10.  He has spasms as well as pain down both legs though worse on the left.  Medication and rest help.  Standing and walking make it worse.  He had 1 bout of this a couple of years ago that was more significant than his baseline pain which was helped significantly by physical therapy.   Bowel/Bladder Dysfunction: none  Conservative measures:  Physical therapy: has participated in maybe 1-2 years ago Multimodal medical therapy including regular antiinflammatories: flexeril, gabapentin, lidocaine patches, prednisone, toradol  Injections: has not had any epidural steroid injections  Past Surgery:  2 prior back surgeries in 2001 with Dr. Jordan Likes and 2005 with Dr. Lynwood Dawley at [facility] has no symptoms of cervical myelopathy.  The symptoms are causing a significant impact on the patient's life.   Review of Systems:  A 10 point review of systems is negative, except for the pertinent positives and negatives detailed in the HPI.  Past Medical History: Past Medical History:  Diagnosis Date   GERD (gastroesophageal reflux disease)    IBS (irritable bowel syndrome)     Past Surgical History: Past Surgical History:  Procedure Laterality Date   BACK SURGERY      Allergies: Allergies as of 06/22/2022   (No Known Allergies)    Medications: No outpatient medications have been marked as taking for the 06/22/22 encounter (Appointment)  with Venetia Night, MD.    Social History: Social History   Tobacco Use   Smoking status: Never   Smokeless tobacco: Never  Vaping Use   Vaping Use: Never used  Substance Use Topics   Alcohol use: No   Drug use: No    Family Medical History: Family History  Problem Relation Age of Onset   Heart failure Mother    Hypertension Mother    Heart failure Father     Physical Examination: There were no vitals filed for this visit.  General: Patient is well developed, well nourished, calm, collected, and in no apparent distress. Attention to examination is appropriate.  Neck:   Supple.  Full range of motion.  Respiratory: Patient is breathing without any difficulty.   NEUROLOGICAL:     Awake, alert, oriented to person, place, and time.  Speech is clear and fluent. Fund of knowledge is appropriate.   Cranial Nerves: Pupils equal round and reactive to light.  Facial tone is symmetric.  Facial sensation is symmetric. Shoulder shrug is symmetric. Tongue protrusion is midline.  There is no pronator drift.  ROM of spine: full.    Strength: Side Biceps Triceps Deltoid Interossei Grip Wrist Ext. Wrist Flex.  R 5 5 5 5 5 5 5   L 5 5 5 5 5 5 5    Side Iliopsoas Quads Hamstring PF DF EHL  R 5 5 5 5 5 5   L 5 5 5 5 5 5    Reflexes are 1+ and  symmetric at the biceps, triceps, brachioradialis, patella and achilles.   Hoffman's is absent.  Clonus is not present.  Toes are down-going.  Bilateral upper and lower extremity sensation is intact to light touch.    No evidence of dysmetria noted.  Gait is antalgic.  He has pain when he stands up from sitting.     Medical Decision Making  Imaging: MRI L 07/02/2021 IMPRESSION: 1. At L5-S1, severe left and moderate right subarticular recess stenosis. Mild bilateral foraminal stenosis. 2. At L4-L5, mild-to-moderate left subarticular recess stenosis and mild left foraminal stenosis.     Electronically Signed   By: Margaretha Sheffield  M.D.   On: 07/02/2021 16:14    I have personally reviewed the images and agree with the above interpretation.  Assessment and Plan: [name] is a pleasant 51 y.o. male with history of low back surgery with low back pain with bilateral sciatica.  He has significant postoperative changes worst at L5-S1 where he has severe left lateral recess and moderate right lateral recess stenosis.  He also has facet arthrosis at L4-5 and L5-S1.  He has substantial degenerative disc disease at L5-S1.  I recommended that he consider physical therapy and injections.  I will make referrals for this.  I will see him back in 8 weeks.  Additionally, I am somewhat concerned that he has chronic radiculopathy.  I will send him for nerve conduction study.  We will reevaluate with lumbar flexion-extension x-rays when he returns to see me.  He will also need updated imaging if he does not improve with conservative management as his current MRI scan is nearly 49-year-old.   I spent a total of 30 minutes in face-to-face and non-face-to-face activities related to this patient's care today.  Thank you for involving me in the care of this patient.      [name] K. Izora Ribas MD, [facility] Neurosurgery

## 2022-06-22 ENCOUNTER — Encounter: Payer: Self-pay | Admitting: Neurosurgery

## 2022-06-22 ENCOUNTER — Ambulatory Visit: Payer: 59 | Admitting: Neurosurgery

## 2022-06-22 VITALS — BP 132/84 | Ht 67.0 in | Wt 170.6 lb

## 2022-06-22 DIAGNOSIS — M5442 Lumbago with sciatica, left side: Secondary | ICD-10-CM

## 2022-06-22 DIAGNOSIS — M5441 Lumbago with sciatica, right side: Secondary | ICD-10-CM | POA: Diagnosis not present

## 2022-06-22 DIAGNOSIS — G8929 Other chronic pain: Secondary | ICD-10-CM

## 2022-07-21 ENCOUNTER — Encounter: Payer: Self-pay | Admitting: Student in an Organized Health Care Education/Training Program

## 2022-07-21 ENCOUNTER — Ambulatory Visit
Payer: 59 | Attending: Student in an Organized Health Care Education/Training Program | Admitting: Student in an Organized Health Care Education/Training Program

## 2022-07-21 VITALS — BP 111/75 | HR 85 | Temp 97.5°F | Resp 17 | Ht 67.0 in | Wt 170.6 lb

## 2022-07-21 DIAGNOSIS — M48061 Spinal stenosis, lumbar region without neurogenic claudication: Secondary | ICD-10-CM | POA: Insufficient documentation

## 2022-07-21 DIAGNOSIS — M961 Postlaminectomy syndrome, not elsewhere classified: Secondary | ICD-10-CM | POA: Diagnosis not present

## 2022-07-21 DIAGNOSIS — M47816 Spondylosis without myelopathy or radiculopathy, lumbar region: Secondary | ICD-10-CM | POA: Diagnosis not present

## 2022-07-21 DIAGNOSIS — M5416 Radiculopathy, lumbar region: Secondary | ICD-10-CM | POA: Diagnosis not present

## 2022-07-21 NOTE — Progress Notes (Signed)
Patient: Calvin Cory Sr.  Service Category: E/M  Provider: Gillis Santa, MD  DOB: [birth year]  DOS: 07/21/2022  Referring Provider: Meade Maw, MD  MRN: [id number]  Setting: Ambulatory outpatient  PCP: Pcp, No  Type: New Patient  Specialty: Interventional Pain Management    Location: Office  Delivery: Face-to-face     Primary Reason(s) for Visit: Encounter for initial evaluation of one or more chronic problems (new to examiner) potentially causing chronic pain, and posing a threat to normal musculoskeletal function. (Level of risk: High) CC: Back Pain (lower)  HPI  Calvin Yoder is a 51 y.o. year old, male patient, who comes for the first time to our practice referred by Meade Maw, MD for our initial evaluation of his chronic pain. He has Chest pain; GERD (gastroesophageal reflux disease); Failed back surgical syndrome; Lumbar facet arthropathy; Bilateral stenosis of lateral recess of lumbar spine; and Lumbar radiculopathy on their problem list. Today he comes in for evaluation of his Back Pain (lower)  Pain Assessment: Location:   Back Radiating:   Onset: More than a month ago Duration: Chronic pain Quality: Stabbing (pt denies pain at this time; says when lifting or rising from a sitting position, pain is exacerbated) Severity: 0-No pain/10 (subjective, self-reported pain score)  Effect on ADL:   Timing: Intermittent Modifying factors: steroids, rest, ice packs, heatlimits daily activities BP: 111/75  HR: 85  Onset and Duration: Gradual Cause of pain: Unknown Severity: Getting better and NAS-11 now: 0/10 Timing: During activity or exercise Aggravating Factors: Bending, Kneeling, Lifiting, Twisting, and Walking Alleviating Factors: Cold packs, Hot packs, Lying down, and Resting Associated Problems: Constipation Quality of Pain: Burning, Pulsating, Sharp, and Shooting Previous Examinations or Tests: CT scan and X-rays Previous Treatments: Physical Therapy, Relaxation  therapy, Steroid treatments by mouth, Strengthening exercises, Stretching exercises, and TENS   Calvin Yoder is a pleasant 51 year old male who presents with a chief complaint of low back pain with occasional radiation into his posterior lateral thighs.  This has been going on for many years but has gotten worse over the last 12 months.  History of 2 prior back surgeries in 2001 with Dr. Annette Stable and 2005 with Dr. Wende Mott at [facility].  He has trouble with lumbar extension and going from sitting to standing.  When his pain does radiate it does go down to his left leg however he states that the majority of his pain is in his lumbar spine.  He was recently evaluated by Dr. Cari Caraway who recommended physical therapy, referral here to consider injection therapy and referral to neurology for EMG/nerve conduction velocity study.  He has participated in physical therapy in the past.  He has tried various medications including Flexeril, gabapentin, lidocaine patches, prednisone taper, Toradol.  He is currently on Suboxone.  He states that he has been told that he may need repeat lumbar spine surgery.   He states that today his back pain is not as severe because he finished a prednisone taper recently which was helpful for his lumbar spine pain.  I will be focusing primarily on interventional pain management for Calvin Yoder. Meds   Current Outpatient Medications:    magnesium hydroxide (PHILLIPS CHEWS) 311 MG CHEW chewable tablet, Chew 311 mg by mouth every 4 (four) hours as needed., Disp: , Rfl:    omeprazole (PRILOSEC) 20 MG capsule, Take 20 mg by mouth daily., Disp: , Rfl:   Imaging Review   MR LUMBAR SPINE WO CONTRAST  Narrative CLINICAL DATA:  Low back  pain, cauda equina syndrome suspected  EXAM: MRI LUMBAR SPINE WITHOUT CONTRAST  TECHNIQUE: Multiplanar, multisequence MR imaging of the lumbar spine was performed. No intravenous contrast was administered.  COMPARISON:  None.  FINDINGS: Segmentation:  Standard segmentation is assumed. The inferior-most fully formed intervertebral disc is labeled L5-S1.  Alignment: Grade 1 retrolisthesis of L5 on S1. Otherwise, no substantial sagittal subluxation.  Vertebrae: Vertebral body heights are maintained. Degenerative/discogenic endplate signal changes about the L5-S1 disc. Otherwise, no focal marrow edema to suggest acute fracture or discitis/osteomyelitis. Mildly heterogeneous bone marrow without suspicious bone lesion.  Conus medullaris and cauda equina: Conus extends to the L1 level. Conus appears normal.  Paraspinal and other soft tissues: Unremarkable.  Disc levels:  T12-L1: No significant disc protrusion, foraminal stenosis, or canal stenosis.  L1-L2: No significant disc protrusion, foraminal stenosis, or canal stenosis.  L2-L3: Small broad disc bulge without significant canal or foraminal stenosis.  L3-L4: Mild bilateral facet arthropathy. Slight disc bulging with small left foraminal disc protrusion. No significant canal or foraminal stenosis.  L4-L5: Mild to moderate bilateral facet arthropathy. Small broad disc bulge with superimposed left foraminal disc protrusion. Mild left foraminal stenosis. No significant right foraminal stenosis. Mild to moderate left subarticular recess narrowing without significant central canal stenosis.  L5-S1: Degenerative disc height loss and desiccation. Left eccentric disc bulge with endplate spurring. Mild facet arthropathy. Mild bilateral foraminal stenosis and mild left foraminal stenosis. Severe left and moderate right subarticular recess narrowing. No significant central canal stenosis  IMPRESSION: 1. At L5-S1, severe left and moderate right subarticular recess stenosis. Mild bilateral foraminal stenosis. 2. At L4-L5, mild-to-moderate left subarticular recess stenosis and mild left foraminal stenosis.   Electronically Signed By: Margaretha Sheffield M.D. On: 07/02/2021  16:14 DG Lumbar Spine Complete  Narrative CLINICAL DATA:  Back pain after lifting  EXAM: LUMBAR SPINE - COMPLETE 4+ VIEW  COMPARISON:  None.  FINDINGS: Normal alignment of lumbar vertebral bodies. No loss of vertebral body height or disc height. No pars fracture. No subluxation. Mild disc space narrowing and endplate osteophytosis at L5-S1  IMPRESSION: No acute findings lumbar spine.   Electronically Signed By: Suzy Bouchard M.D. On: 07/02/2021 13:22   DG Knee Complete 4 Views Right  Narrative CLINICAL DATA:  Acute right knee pain and swelling.  EXAM: RIGHT KNEE - COMPLETE 4+ VIEW  COMPARISON:  None.  FINDINGS: No evidence of fracture, dislocation, or joint effusion. No evidence of arthropathy or other focal bone abnormality. Soft tissues are unremarkable.  IMPRESSION: Negative.   Electronically Signed By: Marijo Conception M.D. On: 11/08/2019 16:19   Narrative CLINICAL DATA:  Continued right upper extremity pain after fall backwards onto outstretched hand 3 months prior. Pain in the wrist and elbow.  EXAM: RIGHT ELBOW - COMPLETE 3+ VIEW  COMPARISON:  None.  FINDINGS: No acute or healing fracture. No dislocation. The alignment and joint spaces are maintained. No joint effusion. No focal soft tissue abnormality.  IMPRESSION: Negative radiographs of the right elbow.   Electronically Signed By: Jeb Levering M.D. On: 08/10/2015 19:01    Narrative CLINICAL DATA:  Continued right upper extremity pain after fall backwards onto outstretched hand 3 months prior. Pain in the wrist and elbow.  EXAM: RIGHT WRIST - COMPLETE 3+ VIEW  COMPARISON:  None.  FINDINGS: No acute or healing fracture. No dislocation. The alignment and joint spaces are maintained. The scaphoid is intact. No focal soft tissue abnormality.  IMPRESSION: Negative radiographs of the right wrist.  Electronically Signed By: Jeb Levering M.D. On: 08/10/2015  18:59    Complexity Note: Imaging results reviewed.                         ROS  Cardiovascular: No reported cardiovascular signs or symptoms such as High blood pressure, coronary artery disease, abnormal heart rate or rhythm, heart attack, blood thinner therapy or heart weakness and/or failure Pulmonary or Respiratory: Coughing up mucus (Bronchitis) Neurological: No reported neurological signs or symptoms such as seizures, abnormal skin sensations, urinary and/or fecal incontinence, being born with an abnormal open spine and/or a tethered spinal cord Psychological-Psychiatric: No reported psychological or psychiatric signs or symptoms such as difficulty sleeping, anxiety, depression, delusions or hallucinations (schizophrenial), mood swings (bipolar disorders) or suicidal ideations or attempts Gastrointestinal: Reflux or heatburn and Irregular, infrequent bowel movements (Constipation) Genitourinary: No reported renal or genitourinary signs or symptoms such as difficulty voiding or producing urine, peeing blood, non-functioning kidney, kidney stones, difficulty emptying the bladder, difficulty controlling the flow of urine, or chronic kidney disease Hematological: No reported hematological signs or symptoms such as prolonged bleeding, low or poor functioning platelets, bruising or bleeding easily, hereditary bleeding problems, low energy levels due to low hemoglobin or being anemic Endocrine: No reported endocrine signs or symptoms such as high or low blood sugar, rapid heart rate due to high thyroid levels, obesity or weight gain due to slow thyroid or thyroid disease Rheumatologic: Rheumatoid arthritis Musculoskeletal: Negative for myasthenia gravis, muscular dystrophy, multiple sclerosis or malignant hyperthermia Work History: Working full time  Allergies  Calvin Yoder has No Known Allergies.  Laboratory Chemistry Profile   Renal Lab Results  Component Value Date   BUN 16 06/17/2022    CREATININE 1.09 06/17/2022   GFRAA >60 08/12/2019   GFRNONAA >60 06/17/2022   PROTEINUR NEGATIVE 06/17/2022     Electrolytes Lab Results  Component Value Date   NA 142 06/17/2022   K 3.9 06/17/2022   CL 109 06/17/2022   CALCIUM 8.8 (L) 06/17/2022     Hepatic Lab Results  Component Value Date   AST 39 06/17/2022   ALT 61 (H) 06/17/2022   ALBUMIN 4.3 06/17/2022   ALKPHOS 55 06/17/2022   LIPASE 42 06/17/2022     ID Lab Results  Component Value Date   HIV Non Reactive 12/09/2018     Bone No results found for: "VD25OH", "VD125OH2TOT", "ER1540GQ6", "PY1950DT2", "25OHVITD1", "25OHVITD2", "67TIWPYK9", "TESTOFREE", "TESTOSTERONE"   Endocrine Lab Results  Component Value Date   GLUCOSE 72 06/17/2022   GLUCOSEU NEGATIVE 06/17/2022   TSH 1.48 06/30/2013     Neuropathy Lab Results  Component Value Date   HIV Non Reactive 12/09/2018     CNS No results found for: "COLORCSF", "APPEARCSF", "RBCCOUNTCSF", "WBCCSF", "POLYSCSF", "LYMPHSCSF", "EOSCSF", "PROTEINCSF", "GLUCCSF", "JCVIRUS", "CSFOLI", "IGGCSF", "LABACHR", "ACETBL"   Inflammation (CRP: Acute  ESR: Chronic) No results found for: "CRP", "ESRSEDRATE", "LATICACIDVEN"   Rheumatology No results found for: "RF", "ANA", "LABURIC", "URICUR", "LYMEIGGIGMAB", "LYMEABIGMQN", "HLAB27"   Coagulation Lab Results  Component Value Date   PLT 256 06/17/2022     Cardiovascular Lab Results  Component Value Date   CKTOTAL 227 06/30/2013   CKMB 3.7 (H) 06/30/2013   TROPONINI <0.03 12/09/2018   HGB 13.6 06/17/2022   HCT 41.0 06/17/2022     Screening Lab Results  Component Value Date   HIV Non Reactive 12/09/2018     Cancer No results found for: "CEA", "CA125", "LABCA2"   Allergens No  results found for: "ALMOND", "APPLE", "ASPARAGUS", "AVOCADO", "BANANA", "BARLEY", "BASIL", "BAYLEAF", "GREENBEAN", "LIMABEAN", "WHITEBEAN", "BEEFIGE", "REDBEET", "BLUEBERRY", "BROCCOLI", "CABBAGE", "MELON", "CARROT", "CASEIN", "CASHEWNUT",  "CAULIFLOWER", "CELERY"     Note: Lab results reviewed.  [place]  Drug: Calvin Yoder  reports no history of drug use. Alcohol:  reports no history of alcohol use. Tobacco:  reports that he has never smoked. He has never used smokeless tobacco. Medical:  has a past medical history of GERD (gastroesophageal reflux disease) and IBS (irritable bowel syndrome). Family: family history includes Heart failure in his father and mother; Hypertension in his mother.  Past Surgical History:  Procedure Laterality Date   BACK SURGERY     Active Ambulatory Problems    Diagnosis Date Noted   Chest pain 12/08/2018   GERD (gastroesophageal reflux disease) 12/08/2018   Failed back surgical syndrome 07/21/2022   Lumbar facet arthropathy 07/21/2022   Bilateral stenosis of lateral recess of lumbar spine 07/21/2022   Lumbar radiculopathy 07/21/2022   Resolved Ambulatory Problems    Diagnosis Date Noted   No Resolved Ambulatory Problems   Past Medical History:  Diagnosis Date   IBS (irritable bowel syndrome)    Constitutional Exam  General appearance: Well nourished, well developed, and well hydrated. In no apparent acute distress Vitals:   07/21/22 1022  BP: 111/75  Pulse: 85  Resp: 17  Temp: (!) 97.5 F (36.4 C)  TempSrc: Temporal  SpO2: 98%  Weight: 170 lb 9.6 oz (77.4 kg)  Height: 5' 7"  (1.702 m)   BMI Assessment: Estimated body mass index is 26.72 kg/m as calculated from the following:   Height as of this encounter: 5' 7"  (1.702 m).   Weight as of this encounter: 170 lb 9.6 oz (77.4 kg).  BMI interpretation table: BMI level Category Range association with higher incidence of chronic pain  <18 kg/m2 Underweight   18.5-24.9 kg/m2 Ideal body weight   25-29.9 kg/m2 Overweight Increased incidence by 20%  30-34.9 kg/m2 Obese (Class I) Increased incidence by 68%  35-39.9 kg/m2 Severe obesity (Class II) Increased incidence by 136%  >40 kg/m2 Extreme obesity (Class III) Increased incidence by  254%   Patient's current BMI Ideal Body weight  Body mass index is 26.72 kg/m. Ideal body weight: 66.1 kg (145 lb 11.6 oz) Adjusted ideal body weight: 70.6 kg (155 lb 10.8 oz)   BMI Readings from Last 4 Encounters:  07/21/22 26.72 kg/m  06/22/22 26.72 kg/m  06/17/22 25.84 kg/m  06/15/22 25.84 kg/m   Wt Readings from Last 4 Encounters:  07/21/22 170 lb 9.6 oz (77.4 kg)  06/22/22 170 lb 9.6 oz (77.4 kg)  06/17/22 165 lb (74.8 kg)  06/15/22 165 lb (74.8 kg)    Psych/Mental status: Alert, oriented x 3 (person, place, & time)       Eyes: PERLA Respiratory: No evidence of acute respiratory distress  Lumbar Spine Area Exam  Skin & Axial Inspection: Well healed scar from previous spine surgery detected Alignment: Symmetrical Functional ROM: Pain restricted ROM affecting both sides Stability: No instability detected Muscle Tone/Strength: Functionally intact. No obvious neuro-muscular anomalies detected. Sensory (Neurological): Neurogenic pain pattern and facet mediated Palpation: No palpable anomalies       Provocative Tests: Hyperextension/rotation test: (+) bilaterally for facet joint pain. Lumbar quadrant test (Kemp's test): (+) bilaterally for facet joint pain. Lateral bending test: (+) due to pain.  Gait & Posture Assessment  Ambulation: Unassisted Gait: Relatively normal for age and body habitus Posture: WNL  Lower Extremity Exam  Side: Right lower extremity  Side: Left lower extremity  Stability: No instability observed          Stability: No instability observed          Skin & Extremity Inspection: Skin color, temperature, and hair growth are WNL. No peripheral edema or cyanosis. No masses, redness, swelling, asymmetry, or associated skin lesions. No contractures.  Skin & Extremity Inspection: Skin color, temperature, and hair growth are WNL. No peripheral edema or cyanosis. No masses, redness, swelling, asymmetry, or associated skin lesions. No contractures.   Functional ROM: Unrestricted ROM                  Functional ROM: Unrestricted ROM                  Muscle Tone/Strength: Functionally intact. No obvious neuro-muscular anomalies detected.  Muscle Tone/Strength: Functionally intact. No obvious neuro-muscular anomalies detected.  Sensory (Neurological): Unimpaired        Sensory (Neurological): Unimpaired        DTR: Patellar: deferred today Achilles: deferred today Plantar: deferred today  DTR: Patellar: deferred today Achilles: deferred today Plantar: deferred today  Palpation: No palpable anomalies  Palpation: No palpable anomalies   5 out of 5 strength bilateral lower extremity: Plantar flexion, dorsiflexion, knee flexion, knee extension.   Assessment  Primary Diagnosis & Pertinent Problem List: The primary encounter diagnosis was Failed back surgical syndrome. Diagnoses of Lumbar facet arthropathy, Bilateral stenosis of lateral recess of lumbar spine (L5-S1), and Lumbar radiculopathy were also pertinent to this visit.  Visit Diagnosis (New problems to examiner): 1. Failed back surgical syndrome   2. Lumbar facet arthropathy   3. Bilateral stenosis of lateral recess of lumbar spine (L5-S1)   4. Lumbar radiculopathy    Plan of Care (Initial workup plan)   I reviewed Calvin Yoder MRI with him in detail.  I encouraged him to proceed with physical therapy.  I discussed lumbar epidural steroid injection as well as diagnostic lumbar facet medial branch nerve blocks with the patient.  Given that his primary pain is in his lower back with MRI evidence of facet arthropathy, I offered the patient diagnostic L3, L4, L5 medial branch nerve block.  Risk and benefits of this were discussed in detail.  I also discussed the potential for lumbar radiofrequency ablation of medial branch nerves if he does have positive response to his diagnostic nerve blocks.  Patient states that he needs to get new insurance and was wondering how much the injections  would cost him.  I instructed him to discuss that with our insurance specialist and his insurance company.  I will place a as needed order for a bilateral L3, L4, L5 medial branch nerve block.  If patient would like to proceed with this, he is instructed to call our clinic to do so after reviewing cost with his insurance.  Future considerations could include lumbar epidural steroid injection, SI joint intervention, possible SCS trial if surgery is not being considered.   Procedure Orders         LUMBAR FACET(MEDIAL BRANCH NERVE BLOCK) MBNB         Provider-requested follow-up: Return if symptoms worsen or fail to improve.  I spent a total of 60 minutes reviewing chart data, face-to-face evaluation with the patient, counseling and coordination of care as detailed above.   Future Appointments  Date Time Provider [place]  08/24/2022 11:45 AM Meade Maw, MD [facility] None    Note by: Carlus Pavlov  Holley Raring, MD Date: 07/21/2022; Time: 1:47 PM

## 2022-07-21 NOTE — Progress Notes (Signed)
Safety precautions to be maintained throughout the outpatient stay will include: orient to surroundings, keep bed in low position, maintain call bell within reach at all times, provide assistance with transfer out of bed and ambulation.  

## 2022-08-24 ENCOUNTER — Ambulatory Visit: Payer: 59 | Admitting: Neurosurgery

## 2022-09-16 ENCOUNTER — Ambulatory Visit: Payer: 59 | Admitting: Neurosurgery

## 2022-09-22 DIAGNOSIS — D225 Melanocytic nevi of trunk: Secondary | ICD-10-CM | POA: Diagnosis not present

## 2022-09-22 DIAGNOSIS — L578 Other skin changes due to chronic exposure to nonionizing radiation: Secondary | ICD-10-CM | POA: Diagnosis not present

## 2022-09-22 DIAGNOSIS — D2261 Melanocytic nevi of right upper limb, including shoulder: Secondary | ICD-10-CM | POA: Diagnosis not present

## 2022-09-22 DIAGNOSIS — L821 Other seborrheic keratosis: Secondary | ICD-10-CM | POA: Diagnosis not present

## 2022-09-22 DIAGNOSIS — D2262 Melanocytic nevi of left upper limb, including shoulder: Secondary | ICD-10-CM | POA: Diagnosis not present

## 2022-09-22 DIAGNOSIS — L918 Other hypertrophic disorders of the skin: Secondary | ICD-10-CM | POA: Diagnosis not present

## 2023-03-08 ENCOUNTER — Emergency Department: Payer: Self-pay

## 2023-03-08 ENCOUNTER — Emergency Department
Admission: EM | Admit: 2023-03-08 | Discharge: 2023-03-08 | Disposition: A | Discharge status: Home / Self Care | Payer: Self-pay | Attending: Student in an Organized Health Care Education/Training Program | Admitting: Student in an Organized Health Care Education/Training Program

## 2023-03-08 DIAGNOSIS — M25561 Pain in right knee: Secondary | ICD-10-CM | POA: Insufficient documentation

## 2023-03-08 NOTE — ED Notes (Signed)
Patient in xray at this time.

## 2023-03-08 NOTE — ED Triage Notes (Signed)
Wheelchair offered multiple times, patient refused.

## 2023-03-08 NOTE — ED Triage Notes (Signed)
Pt presents to the ED due to R knee pain. Pt states he can not put any weight on it. Pt ambulated to triage. Pt A&Ox4

## 2023-03-08 NOTE — Discharge Instructions (Addendum)
Please call orthopedics for further management, as one possibility is your meniscus.  In meantime you may wear the knee brace as needed.  Please return for any new, worsening, or change in symptoms or other concerns.  It was a pleasure caring for you today.

## 2023-03-08 NOTE — ED Notes (Signed)
ED Provider at bedside. 

## 2023-03-08 NOTE — ED Provider Notes (Signed)
[facility] Provider Note    Event Date/Time   First MD Initiated Contact with Patient 03/08/23 1217     (approximate)   History   Knee Pain   HPI  Calvin Gale. is a 52 y.o. male who presents today for evaluation of right-sided knee pain.  Patient reports that he was running after a kitten that was in the middle of the road he felt a sharp pain in his knee.  This was approximately 5 days ago.  He reports that he has been able to ambulate since this happened, but has intermittent pain.  He has not noticed any swelling or erythema.  Has not had any weakness or paresthesias.  He did not have any falls.  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 --      Excl. in GC? --     Most recent vital signs: Vitals:   03/08/23 1208  BP: (!) 150/97  Pulse: 86  Resp: 16  Temp: 98.2 F (36.8 C)  SpO2: 97%    Physical Exam Vitals and nursing note reviewed.  Constitutional:      General: Awake and alert. No acute distress.    Appearance: Normal appearance. The patient is normal weight.  HENT:     Head: Normocephalic and atraumatic.     Mouth: Mucous membranes are moist.  Eyes:     General: PERRL. Normal EOMs        Right eye: No discharge.         Left eye: No discharge.     Conjunctiva/sclera: Conjunctivae normal.  Cardiovascular:     Rate and Rhythm: Normal rate and regular rhythm.     Pulses: Normal pulses.  Pulmonary:     Effort: Pulmonary effort is normal. No respiratory distress.     Breath sounds: Normal breath sounds.  Abdominal:     Abdomen is soft. There is no abdominal tenderness. No rebound or guarding. No distention. Musculoskeletal:        General: No swelling. Normal range of motion.     Cervical back: Normal range of motion and neck supple.   Right knee: No deformity or rash. No joint line tenderness. No patellar tenderness, no ballotment Warm and well perfused extremity with 2+ pedal pulses 5/5 strength to dorsiflexion and plantarflexion at the ankle with intact sensation throughout extremity Normal range of motion of the knee, with intact flexion and extension to active and passive  range of motion. Extensor mechanism intact. No ligamentous laxity. Negative anterior/posterior drawer/negative lachman, pain with mcmurrays No effusion or warmth Intact quadriceps, hamstring function, patellar tendon function Pelvis stable Full ROM of ankle without pain or swelling Foot warm and well perfused  Skin:    General: Skin is warm and dry.     Capillary Refill: Capillary refill takes less than 2 seconds.     Findings: No rash.  Neurological:     Mental Status: The patient is awake and alert.      ED Results / Procedures / Treatments   Labs (all labs ordered are listed, but only abnormal results are displayed) Labs Reviewed - No data to display   EKG     RADIOLOGY I independently reviewed and interpreted imaging and agree with radiologists findings.     PROCEDURES:  Critical Care performed:   Procedures   MEDICATIONS ORDERED IN ED: Medications - No data to display   IMPRESSION / MDM / ASSESSMENT AND PLAN / ED COURSE  I reviewed the triage vital signs and the nursing  notes.   Differential diagnosis includes, but is not limited to, meniscus injury, ligamental injury, effusion, osteoarthritis, less likely fracture or dislocation.  Patient is awake and alert, hemodynamically stable and afebrile.  He is neurologically and neurovascularly intact.  He has full active and passive range of motion of his knees.  There is no obvious deformity, erythema, ecchymosis, or swelling noted. No evidence of neurological deficit or vascular compromise on exam. No fracture/dislocation on X-Ray. No deformity or obvious ligamentous laxity on exam.No constitutional symptoms or effusion to suggest septic joint. No history of immunosuppression. Overall well appearing, vital signs stable. No indication for diagnostic or therapeutic procedure such as arthrocentesis.  There is no popping or clicking with McMurray's maneuver, though he did have pain with this maneuver.  He was instructed.  Appendix symptoms persist.  He was given an Ace wrap in the meantime to help with his pain.  Return precautions and care instructions discussed. Outpatient follow-up advised. Patient agrees with plan of care.   Patient's presentation is most consistent with acute complicated illness / injury requiring diagnostic workup.    FINAL CLINICAL IMPRESSION(S) / ED DIAGNOSES   Final diagnoses:  Acute pain of right knee     Rx / DC Orders   ED Discharge Orders     None        Note:  This document was prepared using Dragon voice recognition software and may include unintentional dictation errors.   Keturah Shavers 03/08/23 1719    Willy Eddy, MD 03/08/23 [phone]

## 2024-02-11 ENCOUNTER — Other Ambulatory Visit: Payer: Self-pay

## 2024-02-11 ENCOUNTER — Encounter: Payer: Self-pay | Admitting: Intensive Care

## 2024-02-11 ENCOUNTER — Emergency Department
Admission: EM | Admit: 2024-02-11 | Discharge: 2024-02-11 | Disposition: A | Discharge status: Home / Self Care | Payer: Self-pay | Attending: Emergency Medicine | Admitting: Emergency Medicine

## 2024-02-11 DIAGNOSIS — H6123 Impacted cerumen, bilateral: Secondary | ICD-10-CM | POA: Insufficient documentation

## 2024-02-11 DIAGNOSIS — H8112 Benign paroxysmal vertigo, left ear: Secondary | ICD-10-CM | POA: Insufficient documentation

## 2024-02-11 DIAGNOSIS — H6690 Otitis media, unspecified, unspecified ear: Secondary | ICD-10-CM

## 2024-02-11 DIAGNOSIS — H6693 Otitis media, unspecified, bilateral: Secondary | ICD-10-CM | POA: Insufficient documentation

## 2024-02-11 LAB — CBC
HCT: 40.3 % (ref 39.0–52.0)
Hemoglobin: 13.6 g/dL (ref 13.0–17.0)
MCH: 28.4 pg (ref 26.0–34.0)
MCHC: 33.7 g/dL (ref 30.0–36.0)
MCV: 84.1 fL (ref 80.0–100.0)
Platelets: 263 10*3/uL (ref 150–400)
RBC: 4.79 MIL/uL (ref 4.22–5.81)
RDW: 13.2 % (ref 11.5–15.5)
WBC: 6.9 10*3/uL (ref 4.0–10.5)
nRBC: 0 % (ref 0.0–0.2)

## 2024-02-11 LAB — COMPREHENSIVE METABOLIC PANEL WITH GFR
ALT: 45 U/L — ABNORMAL HIGH (ref 0–44)
AST: 38 U/L (ref 15–41)
Albumin: 4.2 g/dL (ref 3.5–5.0)
Alkaline Phosphatase: 52 U/L (ref 38–126)
Anion gap: 9 (ref 5–15)
BUN: 18 mg/dL (ref 6–20)
CO2: 24 mmol/L (ref 22–32)
Calcium: 8.8 mg/dL — ABNORMAL LOW (ref 8.9–10.3)
Chloride: 107 mmol/L (ref 98–111)
Creatinine, Ser: 1.05 mg/dL (ref 0.61–1.24)
GFR, Estimated: >60 mL/min (ref >60)
Glucose, Bld: 112 mg/dL — ABNORMAL HIGH (ref 70–99)
Potassium: 4.1 mmol/L (ref 3.5–5.1)
Sodium: 140 mmol/L (ref 135–145)
Total Bilirubin: 0.8 mg/dL (ref 0.0–1.2)
Total Protein: 7.3 g/dL (ref 6.5–8.1)

## 2024-02-11 MED ORDER — ONDANSETRON 4 MG PO TBDP
4.0000 mg | ORAL_TABLET | Freq: Once | ORAL | Status: AC
  Administered 2024-02-11: 4 mg via ORAL
  Filled 2024-02-11: qty 1

## 2024-02-11 MED ORDER — MECLIZINE HCL 25 MG PO TABS
25.0000 mg | ORAL_TABLET | Freq: Once | ORAL | Status: AC
  Administered 2024-02-11: 25 mg via ORAL
  Filled 2024-02-11: qty 1

## 2024-02-11 MED ORDER — MECLIZINE HCL 25 MG PO TABS: 25.0000 mg | ORAL_TABLET | Freq: Three times a day (TID) | ORAL | 0 refills | Status: DC | PRN

## 2024-02-11 MED ORDER — CARBAMIDE PEROXIDE 6.5 % OT SOLN
5.0000 [drp] | Freq: Once | OTIC | Status: AC
  Administered 2024-02-11: 5 [drp] via OTIC
  Filled 2024-02-11: qty 15

## 2024-02-11 MED ORDER — AMOXICILLIN-POT CLAVULANATE 875-125 MG PO TABS: 1.0000 | ORAL_TABLET | Freq: Two times a day (BID) | ORAL | 0 refills | Status: AC

## 2024-02-11 NOTE — ED Notes (Signed)
 EDP at bedside

## 2024-02-11 NOTE — ED Provider Notes (Signed)
 [facility] Provider Note    Event Date/Time   First MD Initiated Contact with Patient 02/11/24 1554     (approximate)   History   Dizziness   HPI Calvin Yoder. is a 53 y.o. male presenting today for dizziness.  Patient says he has recurrent symptoms of dizziness for many years.  Today felt worse.  He woke up to having dizziness symptoms this morning.  Does not feel them at rest but when he stands and turns his head or attempts to walk notices room spinning sensation.  No nausea or vomiting.  No numbness or weakness anywhere.  No speech changes or vision changes.  Has never seen a doctor for the symptoms before.     Physical Exam   Triage Vital Signs: ED Triage Vitals [02/11/24 1550]  Encounter Vitals Group     BP (!) 154/119     Systolic BP Percentile      Diastolic BP Percentile      Pulse Rate 68     Resp 16     Temp 98 F (36.7 C)     Temp Source Oral     SpO2 99 %     Weight 165 lb (74.8 kg)     Height 5\' 7"  (1.702 m)     Head Circumference      Peak Flow      Pain Score 0     Pain Loc      Pain Education      Exclude from Growth Chart     Most recent vital signs: Vitals:   02/11/24 1550 02/11/24 1630  BP: (!) 154/119 (!) 134/94  Pulse: 68 75  Resp: 16   Temp: 98 F (36.7 C)   SpO2: 99% 98%   Physical Exam: I have reviewed the vital signs and nursing notes. General: Awake, alert, no acute distress.  Nontoxic appearing. Head:  Atraumatic, normocephalic.   ENT:  EOM intact, PERRL. Oral mucosa is pink and moist with no lesions.  Bilateral ear canals with impacted cerumen Neck: Neck is supple with full range of motion, No meningeal signs. Cardiovascular:  RRR, No murmurs. Peripheral pulses palpable and equal bilaterally. Respiratory:  Symmetrical chest wall expansion.  No rhonchi, rales, or wheezes.  Good air movement throughout.  No use of accessory muscles.   Musculoskeletal:  No cyanosis or edema. Moving extremities with  full ROM Abdomen:  Soft, nontender, nondistended. Neuro:  GCS 15, moving all four extremities, interacting appropriately. Speech clear.  Cranial nerves II through XII intact.  Positive Dix-Hallpike to the left with horizontal nystagmus.  Sensation equal and intact throughout bilateral upper and lower extremities.  Strength equal and intact throughout bilateral upper and lower extremities. Psych:  Calm, appropriate.   Skin:  Warm, dry, no rash.    ED Results / Procedures / Treatments   Labs (all labs ordered are listed, but only abnormal results are displayed) Labs Reviewed  COMPREHENSIVE METABOLIC PANEL WITH GFR - Abnormal; Notable for the following components:      Result Value   Glucose, Bld 112 (*)    Calcium 8.8 (*)    ALT 45 (*)    All other components within normal limits  CBC     EKG My EKG interpretation: Rate of 69, normal sinus rhythm, normal axis, normal intervals.  No acute ST elevations or depressions.   RADIOLOGY    PROCEDURES:  Critical Care performed: No  Procedures   MEDICATIONS ORDERED IN  ED: Medications  meclizine (ANTIVERT) tablet 25 mg (25 mg Oral Given 02/11/24 1636)  ondansetron  (ZOFRAN -ODT) disintegrating tablet 4 mg (4 mg Oral Given 02/11/24 1636)  carbamide peroxide (DEBROX) 6.5 % OTIC (EAR) solution 5 drop (5 drops Both EARS Given 02/11/24 1653)     IMPRESSION / MDM / ASSESSMENT AND PLAN / ED COURSE  I reviewed the triage vital signs and the nursing notes.                              Differential diagnosis includes, but is not limited to, BPPV, impacted cerumen, acute otitis media, otitis externa, CVA  Patient's presentation is most consistent with acute complicated illness / injury requiring diagnostic workup.  Patient is a 53 year old male with recurrent symptoms of vertigo presenting today for new onset today.  No other acute symptoms and neurological exam otherwise negative.  He does have horizontal nystagmus with left-sided  Dix-Hallpike testing as well as impacted cerumen bilaterally.  His initial exam seems more consistent with positional vertigo versus inner ear infection with absence of other neurological signs and recurrent episodes for many years.  Will attempt to treat with meclizine and Zofran  as well as clear out ears bilaterally.  Ears successfully cleaned out and does appear to have concern for otitis media on his left ear.  He noted after this and the meclizine that his symptoms have completely resolved.  No ongoing vertigo symptoms at rest which he never had nor with movement in head.  Able to ambulate without any symptoms.  Do not think additional imaging is warranted at this time and patient is agreeable with this.  Discharged with meclizine and Augmentin  for otitis media.  Told to follow-up with PCP and given strict return precautions.  The patient is on the cardiac monitor to evaluate for evidence of arrhythmia and/or significant heart rate changes. Clinical Course as of 02/11/24 1751  Sat Feb 11, 2024  1749 Reassessed patient following ear cleaning.  Does have some erythema in the left inner ear so we will treat.  Otherwise, he had complete resolution of vertigo symptoms.  Previously had tinnitus which is also resolved.  Able to ambulate without any concerns. [DW]    Clinical Course User Index [DW] Kandee Orion, MD     FINAL CLINICAL IMPRESSION(S) / ED DIAGNOSES   Final diagnoses:  Benign paroxysmal positional vertigo of left ear  Acute otitis media, unspecified otitis media type     Rx / DC Orders   ED Discharge Orders          Ordered    meclizine (ANTIVERT) 25 MG tablet  3 times daily PRN        02/11/24 1751    amoxicillin -clavulanate (AUGMENTIN ) 875-125 MG tablet  2 times daily        02/11/24 1751             Note:  This document was prepared using Dragon voice recognition software and may include unintentional dictation errors.   Kandee Orion, MD 02/11/24 [phone]

## 2024-02-11 NOTE — ED Notes (Signed)
 Patient was able to walk down the hallway and not feel dizzy.

## 2024-02-11 NOTE — ED Notes (Signed)
 2 RN's at bedside. Cleaning out patietns ears with warm NS and hydrogen peroxide

## 2024-02-11 NOTE — ED Notes (Signed)
 Alerted MD that ears have been cleaned out.

## 2024-02-11 NOTE — ED Triage Notes (Signed)
 Patient reports when he looks up and intermittent movements cause dizziness and sometimes the room spins. Reports some crackling in left ear

## 2024-02-11 NOTE — Discharge Instructions (Signed)
 I have sent medication for you to take as needed to help with your vertigo symptoms.  Please follow-up with your primary care provider.  If your vertigo symptoms remain constant even at rest, please return to the emergency department immediately.  I have also sent an antibiotic to help treat a potential inner ear infection on your left ear.

## 2024-02-11 NOTE — ED Notes (Signed)
 EDP at bedside explaining POC to patient.

## 2024-03-12 ENCOUNTER — Emergency Department: Payer: Self-pay

## 2024-03-12 ENCOUNTER — Emergency Department
Admission: EM | Admit: 2024-03-12 | Discharge: 2024-03-13 | Disposition: A | Discharge status: Home / Self Care | Payer: Self-pay | Attending: Emergency Medicine | Admitting: Emergency Medicine

## 2024-03-12 ENCOUNTER — Other Ambulatory Visit: Payer: Self-pay

## 2024-03-12 DIAGNOSIS — R42 Dizziness and giddiness: Secondary | ICD-10-CM | POA: Insufficient documentation

## 2024-03-12 DIAGNOSIS — R519 Headache, unspecified: Secondary | ICD-10-CM | POA: Insufficient documentation

## 2024-03-12 LAB — CBC WITH DIFFERENTIAL/PLATELET
Abs Immature Granulocytes: 0.04 10*3/uL (ref 0.00–0.07)
Basophils Absolute: 0.1 10*3/uL (ref 0.0–0.1)
Basophils Relative: 1 %
Eosinophils Absolute: 0.2 10*3/uL (ref 0.0–0.5)
Eosinophils Relative: 3 %
HCT: 39.2 % (ref 39.0–52.0)
Hemoglobin: 13.5 g/dL (ref 13.0–17.0)
Immature Granulocytes: 1 %
Lymphocytes Relative: 27 %
Lymphs Abs: 2.1 10*3/uL (ref 0.7–4.0)
MCH: 29 pg (ref 26.0–34.0)
MCHC: 34.4 g/dL (ref 30.0–36.0)
MCV: 84.1 fL (ref 80.0–100.0)
Monocytes Absolute: 0.6 10*3/uL (ref 0.1–1.0)
Monocytes Relative: 8 %
Neutro Abs: 4.7 10*3/uL (ref 1.7–7.7)
Neutrophils Relative %: 60 %
Platelets: 257 10*3/uL (ref 150–400)
RBC: 4.66 MIL/uL (ref 4.22–5.81)
RDW: 13.7 % (ref 11.5–15.5)
WBC: 7.8 10*3/uL (ref 4.0–10.5)
nRBC: 0 % (ref 0.0–0.2)

## 2024-03-12 LAB — BASIC METABOLIC PANEL WITH GFR
Anion gap: 10 (ref 5–15)
BUN: 17 mg/dL (ref 6–20)
CO2: 20 mmol/L — ABNORMAL LOW (ref 22–32)
Calcium: 8.7 mg/dL — ABNORMAL LOW (ref 8.9–10.3)
Chloride: 109 mmol/L (ref 98–111)
Creatinine, Ser: 0.95 mg/dL (ref 0.61–1.24)
GFR, Estimated: >60 mL/min (ref >60)
Glucose, Bld: 130 mg/dL — ABNORMAL HIGH (ref 70–99)
Potassium: 3.7 mmol/L (ref 3.5–5.1)
Sodium: 139 mmol/L (ref 135–145)

## 2024-03-12 LAB — TROPONIN I (HIGH SENSITIVITY)
Troponin I (High Sensitivity): 4 ng/L (ref <18)
Troponin I (High Sensitivity): 4 ng/L (ref <18)

## 2024-03-12 MED ORDER — SODIUM CHLORIDE 0.9 % IV BOLUS (SEPSIS)
1000.0000 mL | Freq: Once | INTRAVENOUS | Status: AC
  Administered 2024-03-12: 1000 mL via INTRAVENOUS

## 2024-03-12 MED ORDER — MECLIZINE HCL 25 MG PO TABS
50.0000 mg | ORAL_TABLET | Freq: Once | ORAL | Status: AC
  Administered 2024-03-12: 50 mg via ORAL
  Filled 2024-03-12: qty 2

## 2024-03-12 MED ORDER — KETOROLAC TROMETHAMINE 30 MG/ML IJ SOLN
30.0000 mg | Freq: Once | INTRAMUSCULAR | Status: AC
  Administered 2024-03-12: 30 mg via INTRAVENOUS
  Filled 2024-03-12: qty 1

## 2024-03-12 MED ORDER — PROCHLORPERAZINE EDISYLATE 10 MG/2ML IJ SOLN
10.0000 mg | Freq: Once | INTRAMUSCULAR | Status: AC
  Administered 2024-03-12: 10 mg via INTRAVENOUS
  Filled 2024-03-12: qty 2

## 2024-03-12 NOTE — ED Provider Notes (Signed)
 [facility] Provider Note    Event Date/Time   First MD Initiated Contact with Patient 03/12/24 2308     (approximate)   History   Dizziness   HPI  Calvin Yoder. is a 53 y.o. male with history of GERD, IBS who presents to the emergency department with complaints of intermittent dizziness that he describes as feeling like the room is moving when he turns his head ongoing for several weeks.  Also complains of generalized headache.  No head injury.  Not on blood thinners.  No numbness, tingling, weakness.  No fever.  No chest pain or shortness of breath.  He is also worried because his blood pressure is elevated.  He has never been diagnosed with hypertension before.   History provided by patient, caregiver.    Past Medical History:  Diagnosis Date   GERD (gastroesophageal reflux disease)    IBS (irritable bowel syndrome)     Past Surgical History:  Procedure Laterality Date   BACK SURGERY      MEDICATIONS:  Prior to Admission medications   Medication Sig Start Date End Date Taking? Authorizing Provider  magnesium hydroxide (PHILLIPS CHEWS) 311 MG CHEW chewable tablet Chew 311 mg by mouth every 4 (four) hours as needed.    [provider]  meclizine  (ANTIVERT ) 25 MG tablet Take 1 tablet (25 mg total) by mouth 3 (three) times daily as needed for dizziness. 02/11/24   Kandee Orion, MD  omeprazole (PRILOSEC) 20 MG capsule Take 20 mg by mouth daily.    [provider]    Physical Exam   Triage Vital Signs: ED Triage Vitals  Encounter Vitals Group     BP 03/12/24 2024 (!) 165/110     Systolic BP Percentile --      Diastolic BP Percentile --      Pulse Rate 03/12/24 2024 81     Resp 03/12/24 2024 18     Temp 03/12/24 2024 99.2 F (37.3 C)     Temp src --      SpO2 03/12/24 2024 98 %     Weight 03/12/24 2023 170 lb (77.1 kg)     Height 03/12/24 2023 5\' 7"  (1.702 m)     Head Circumference --      Peak Flow --      Pain  Score 03/12/24 2023 8     Pain Loc --      Pain Education --      Exclude from Growth Chart --     Most recent vital signs: Vitals:   03/13/24 0024 03/13/24 0110  BP: (!) 137/91 (!) 148/88  Pulse: 68 60  Resp: 16 16  Temp:  97.8 F (36.6 C)  SpO2: 97% 100%    CONSTITUTIONAL: Alert, responds appropriately to questions. Well-appearing; well-nourished HEAD: Normocephalic, atraumatic EYES: Conjunctivae clear, pupils appear equal, sclera nonicteric ENT: normal nose; moist mucous membranes NECK: Supple, normal ROM CARD: RRR; S1 and S2 appreciated RESP: Normal chest excursion without splinting or tachypnea; breath sounds clear and equal bilaterally; no wheezes, no rhonchi, no rales, no hypoxia or respiratory distress, speaking full sentences ABD/GI: Non-distended; soft, non-tender, no rebound, no guarding, no peritoneal signs BACK: The back appears normal EXT: Normal ROM in all joints; no deformity noted, no edema SKIN: Normal color for age and race; warm; no rash on exposed skin NEURO: Moves all extremities equally, normal speech, normal sensation, normal gait, no facial asymmetry PSYCH: The patient's mood and manner  are appropriate.   ED Results / Procedures / Treatments   LABS: (all labs ordered are listed, but only abnormal results are displayed) Labs Reviewed  BASIC METABOLIC PANEL WITH GFR - Abnormal; Notable for the following components:      Result Value   CO2 20 (*)    Glucose, Bld 130 (*)    Calcium 8.7 (*)    All other components within normal limits  CBC WITH DIFFERENTIAL/PLATELET  TROPONIN I (HIGH SENSITIVITY)  TROPONIN I (HIGH SENSITIVITY)     EKG:  EKG Interpretation Date/Time:  Monday March 12 2024 65:78:46 EDT Ventricular Rate:  74 PR Interval:  126 QRS Duration:  78 QT Interval:  384 QTC Calculation: 426 R Axis:   84  Text Interpretation: Normal sinus rhythm with sinus arrhythmia Normal ECG When compared with ECG of 11-Feb-2024 15:54, No  significant change was found Confirmed by Verneda Golder [phone]) on 03/12/2024 11:16:13 PM         RADIOLOGY: My personal review and interpretation of imaging: CT head unremarkable.  I have personally reviewed all radiology reports.   CT HEAD WO CONTRAST ( ) Result Date: 03/12/2024 CLINICAL DATA:  Dizziness EXAM: CT HEAD WITHOUT CONTRAST TECHNIQUE: Contiguous axial images were obtained from the base of the skull through the vertex without intravenous contrast. RADIATION DOSE REDUCTION: This exam was performed according to the departmental dose-optimization program which includes automated exposure control, adjustment of the mA and/or kV according to patient size and/or use of iterative reconstruction technique. COMPARISON:  05/29/2018 FINDINGS: Brain: No acute intracranial abnormality. Specifically, no hemorrhage, hydrocephalus, mass lesion, acute infarction, or significant intracranial injury. Vascular: No hyperdense vessel or unexpected calcification. Skull: No acute calvarial abnormality. Sinuses/Orbits: No acute findings Other: None IMPRESSION: No acute intracranial abnormality. Electronically Signed   By: Janeece Mechanic M.D.   On: 03/12/2024 21:01     PROCEDURES:  Critical Care performed: No     .1-3 Lead EKG Interpretation  Performed by: Rudell Marlowe, Clover Dao, DO Authorized by: Andreyah Natividad, Clover Dao, DO     Interpretation: normal     ECG rate:  60   ECG rate assessment: normal     Rhythm: sinus rhythm     Ectopy: none     Conduction: normal       IMPRESSION / MDM / ASSESSMENT AND PLAN / ED COURSE  I reviewed the triage vital signs and the nursing notes.    Patient here with headache, vertigo, hypertension.  The patient is on the cardiac monitor to evaluate for evidence of arrhythmia and/or significant heart rate changes.   DIFFERENTIAL DIAGNOSIS (includes but not limited to):   Benign positional vertigo, hypertensive headache, migraine, tension headache, dehydration, low  suspicion for CVA, intracranial hemorrhage, doubt meningitis   Patient's presentation is most consistent with acute presentation with potential threat to life or bodily function.   PLAN: Workup initiated from triage.  Normal hemoglobin, electrolytes, glucose.  Troponin negative x 2.  CT head reviewed and interpreted by myself and the radiologist and is unremarkable.  Will give IV fluids, Toradol , Compazine for headache and meclizine  for vertigo.  I do not feel he needs an MRI at this time as no suspicion for stroke is low.   MEDICATIONS GIVEN IN ED: Medications  sodium chloride  0.9 % bolus 1,000 mL (0 mLs Intravenous Stopped 03/13/24 0109)  meclizine  (ANTIVERT ) tablet 50 mg (50 mg Oral Given 03/12/24 2344)  ketorolac  (TORADOL ) 30 MG/ML injection 30 mg (30 mg Intravenous Given 03/12/24 2347)  prochlorperazine (  COMPAZINE) injection 10 mg (10 mg Intravenous Given 03/12/24 2347)     ED COURSE: On reassessment, patient states his headache is now resolved and his vertigo is gone as well.  He is able to ambulate, tolerate p.o.  His blood pressure has also improved to the 130s to 140s/80s to 90s.  Have advised him to follow-up with primary care but I do not feel he needs to be started on blood pressure medication from the ED.  Recommended Tylenol , Motrin  over-the-counter as needed for headache in the future and will discharge with prescription for meclizine .  It does appear this is his second visit in the past couple of months for vertigo.  Will refer to ENT as well.  At this time, I do not feel there is any life-threatening condition present. I reviewed all nursing notes, vitals, pertinent previous records.  All lab and urine results, EKGs, imaging ordered have been independently reviewed and interpreted by myself.  I reviewed all available radiology reports from any imaging ordered this visit.  Based on my assessment, I feel the patient is safe to be discharged home without further emergent workup and can  continue workup as an outpatient as needed. Discussed all findings, treatment plan as well as usual and customary return precautions.  They verbalize understanding and are comfortable with this plan.  Outpatient follow-up has been provided as needed.  All questions have been answered.    CONSULTS:  none   OUTSIDE RECORDS REVIEWED: Reviewed last admission in March 2020.       FINAL CLINICAL IMPRESSION(S) / ED DIAGNOSES   Final diagnoses:  Generalized headache  Vertigo     Rx / DC Orders   ED Discharge Orders          Ordered    Ambulatory Referral to Primary Care (Establish Care)        03/13/24 0043    ibuprofen  (ADVIL ) 800 MG tablet  Every 8 hours PRN        03/13/24 0044    meclizine  (ANTIVERT ) 25 MG tablet  3 times daily PRN        03/13/24 0044             Note:  This document was prepared using Dragon voice recognition software and may include unintentional dictation errors.   Murle Otting, Clover Dao, DO 03/13/24 [phone]

## 2024-03-12 NOTE — ED Triage Notes (Signed)
 Pt reports he has had dizziness intermittently for the past 3 weeks, pt reports today he developed headache with dizziness following a nap. Pt states he went to sleep at 1315 and woke up at 1600. Pt denies chest pain numbness or weakness.

## 2024-03-12 NOTE — ED Notes (Signed)
 Dr. Felipe Horton to put in orders at this time

## 2024-03-13 MED ORDER — IBUPROFEN 800 MG PO TABS: 800.0000 mg | ORAL_TABLET | Freq: Three times a day (TID) | ORAL | 0 refills | Status: AC | PRN

## 2024-03-13 MED ORDER — MECLIZINE HCL 25 MG PO TABS: 25.0000 mg | ORAL_TABLET | Freq: Three times a day (TID) | ORAL | 0 refills | Status: AC | PRN

## 2024-03-13 NOTE — Discharge Instructions (Addendum)
You may alternate Tylenol 1000 mg every 6 hours as needed for pain, fever and Ibuprofen 800 mg every 6-8 hours as needed for pain, fever.  Please take Ibuprofen with food.  Do not take more than 4000 mg of Tylenol (acetaminophen) in a 24 hour period. ° °

## 2024-07-10 ENCOUNTER — Emergency Department: Admit: 2024-07-10 | Payer: 59

## 2024-07-10 ENCOUNTER — Inpatient Hospital Stay
Admit: 2024-07-10 | Discharge: 2024-07-10 | Disposition: A | Discharge status: Home / Self Care | Payer: 59 | Arrived: VH | Attending: Emergency Medicine

## 2024-07-10 DIAGNOSIS — S46912A Strain of unspecified muscle, fascia and tendon at shoulder and upper arm level, left arm, initial encounter: Principal | ICD-10-CM

## 2024-07-10 MED ORDER — ACETAMINOPHEN 500 MG PO TABS
500 | ORAL_TABLET | Freq: Four times a day (QID) | ORAL | 1 refills | Status: AC | PRN
Start: 2024-07-10 — End: ?

## 2024-07-10 MED ORDER — KETOROLAC TROMETHAMINE 30 MG/ML IJ SOLN
30 | Freq: Once | INTRAMUSCULAR | Status: AC
Start: 2024-07-10 — End: 2024-07-10
  Administered 2024-07-10: 23:00:00 30 mg via INTRAMUSCULAR

## 2024-07-10 MED ORDER — IBUPROFEN 600 MG PO TABS
600 | ORAL_TABLET | Freq: Four times a day (QID) | ORAL | 0 refills | Status: AC | PRN
Start: 2024-07-10 — End: ?

## 2024-07-10 MED FILL — KETOROLAC TROMETHAMINE 30 MG/ML IJ SOLN: 30 mg/mL | INTRAMUSCULAR | Qty: 1

## 2024-07-10 NOTE — ED Provider Notes (Signed)
 "       [facility] EMERGENCY DEPARTMENT  EMERGENCY DEPARTMENT ENCOUNTER      Pt Name: Marco Day  MRN: [id number]  Birthdate 1971/05/25  Date of evaluation: 07/10/2024  Provider: Harlene SHAUNNA Riding, PA-C    CHIEF COMPLAINT       Chief Complaint   Patient presents with    Shoulder Pain         HISTORY OF PRESENT ILLNESS   (Location/Symptom, Timing/Onset, Context/Setting, Quality, Duration, Modifying Factors, Severity)  Note limiting factors.   53 yo male presenting with c/o left shoulder pain that started about 3 days ago after lifting weights.  Patient notes that while he was lifting weights he felt a pop in the right shoulder which proceeded his pain today.  The patient notes that his pain extends down the left arm starting at the shoulder.  Patient notes that he has had a history of injections into the shoulder in the past.            Review of External Medical Records:     Nursing Notes were reviewed.    REVIEW OF SYSTEMS    (2-9 systems for level 4, 10 or more for level 5)     Review of Systems   Musculoskeletal:  Positive for arthralgias (Left shoulder pain).   All other systems reviewed and are negative.      Except as noted above the remainder of the review of systems was reviewed and negative.       PAST MEDICAL HISTORY   No past medical history on file.      SURGICAL HISTORY       Past Surgical History:   Procedure Laterality Date    HERNIA REPAIR           CURRENT MEDICATIONS       Discharge Medication List as of 07/10/2024  7:16 PM          ALLERGIES     Patient has no known allergies.    FAMILY HISTORY     No family history on file.       SOCIAL HISTORY       Social History     Socioeconomic History    Marital status: Divorced     Social Drivers of Psychologist, [employer] Strain: Low Risk  (07/11/2019)    Received from [facility] ([facility]    Overall Financial Resource Strain (CARDIA)     Difficulty of Paying Living Expenses: Not very hard   Food Insecurity: Unknown (07/11/2019)    Received  from [facility] ([facility]    Hunger Vital Sign     Worried About Running Out of Food in the Last Year: Patient declined     Ran Out of Food in the Last Year: Patient declined   Transportation Needs: Unknown (07/11/2019)    Received from [facility] ([facility])    PRAPARE - Therapist, Art (Medical): Patient declined     Lack of Transportation (Non-Medical): Patient declined           PHYSICAL EXAM    (up to 7 for level 4, 8 or more for level 5)     ED Triage Vitals [07/10/24 1729]   BP Girls Systolic BP Percentile Girls Diastolic BP Percentile Boys Systolic BP Percentile Boys Diastolic BP Percentile Temp Temp src Pulse   (!) 140/80 -- -- -- -- 97.9 F (36.6 C) --  87      Respirations SpO2 Height Weight - Scale       18 98 % 1.676 m (5' 6) 72.6 kg (160 lb)           Body mass index is 25.82 kg/m.    Physical Exam  HENT:      Head: Normocephalic.      Mouth/Throat:      Mouth: Mucous membranes are moist.   Eyes:      General: No scleral icterus.  Cardiovascular:      Rate and Rhythm: Normal rate.   Pulmonary:      Effort: Pulmonary effort is normal.   Abdominal:      General: There is no distension.   Musculoskeletal:         General: No deformity.      Comments: No erythema, ecchymoses appreciable over the left shoulder, tender to palpation over the left anterior glenohumeral joint, pain with active left shoulder flexion and internal rotation, left upper extremity neurovascularly intact   Skin:     General: Skin is warm.   Neurological:      Mental Status: He is alert.   Psychiatric:         Mood and Affect: Mood normal.         DIAGNOSTIC RESULTS     EKG: All EKG's are interpreted by the Emergency Department Physician who either signs or Co-signs this chart in the absence of a cardiologist.        RADIOLOGY:   Non-plain film images such as CT, Ultrasound and MRI are read by the radiologist. Plain radiographic images are visualized and preliminarily interpreted by  the emergency physician with the below findings:        Interpretation per the Radiologist below, if available at the time of this note:    XR SHOULDER LEFT (MIN 2 VIEWS)   Final Result   No acute abnormality.      Electronically signed by Oneil Fireman, MD           LABS:  Labs Reviewed - No data to display    All other labs were within normal range or not returned as of this dictation.    EMERGENCY DEPARTMENT COURSE and DIFFERENTIAL DIAGNOSIS/MDM:   Vitals:    Vitals:    07/10/24 1729   BP: (!) 140/80   Pulse: 87   Resp: 18   Temp: 97.9 F (36.6 C)   SpO2: 98%   Weight: 72.6 kg (160 lb)   Height: 1.676 m (5' 6)           Medical Decision Making  53 year old male presenting with complaints of left shoulder pain.  Differentials include fracture, dislocation, strain, biceps tendinopathy, frozen shoulder, rotator cuff injury.  X-ray of the left shoulder was ordered and shows no acute abnormality at this time.  Due to the patient's pain, will provide him with a sling for the left shoulder today for additional support.  Recommended the patient follow-up with orthopedics which was provided today.  Advised patient to return to the ER if he develops any new or worsening symptoms.    Amount and/or Complexity of Data Reviewed  Radiology: ordered.    Risk  OTC drugs.  Prescription drug management.            REASSESSMENT            CONSULTS:  None    PROCEDURES:  Unless otherwise noted below, none  Procedures      FINAL IMPRESSION      1. Strain of left shoulder, initial encounter          DISPOSITION/PLAN   DISPOSITION Decision To Discharge 07/10/2024 07:19:12 PM      PATIENT REFERRED TO:  [facility] Emergency Department  [address]  [place]  [phone]  [phone]    As needed, If symptoms worsen    [facility]  [address] [facility] [place]  [address]  [place]  [phone]  Schedule an appointment as soon as possible for a visit         DISCHARGE MEDICATIONS:  Discharge Medication List as of  07/10/2024  7:16 PM        START taking these medications    Details   acetaminophen  (TYLENOL ) 500 MG tablet Take 1 tablet by mouth 4 times daily as needed for Pain, Disp-360 tablet, R-1Normal      ibuprofen  (ADVIL ;MOTRIN ) 600 MG tablet Take 1 tablet by mouth 4 times daily as needed for Pain, Disp-120 tablet, R-0Normal               (Please note that portions of this note were completed with a voice recognition program.  Efforts were made to edit the dictations but occasionally words are mis-transcribed.)    Harlene SHAUNNA Riding, PA-C (electronically signed)  Emergency Attending Physician / Physician Assistant / Nurse Practitioner             Riding Harlene SHAUNNA, PA-C  07/10/24 2338    "

## 2024-07-10 NOTE — ED Triage Notes (Signed)
"  Pt amb with steady gait c/o L shoulder pain r/t L arm with numbness ongoing x 2-3 days ago after lifting weights. Pt sts took robaxin and tylenol  approx 1.5 hours PTA. Pt sts heard pop upon incident.     LUE +pulse +sensation +limited ROM due to pain  "

## 2024-07-10 NOTE — Discharge Instructions (Signed)
"  Please pick up your prescriptions from the pharmacy. Please call and schedule a follow-up appointment with orthopedics after today's visit. Avoid any heavy lifting using the left shoulder. Tylenol  and ibuprofen  can be alternated every 3 hours as needed for pain. If you develop any new or worsening symptoms, please return to the ER.  "

## 2024-07-10 NOTE — ED Notes (Signed)
"  Pt given discharge instructions, patient education, prescriptions and follow up information. Pt verbalizes understanding. All questions answered. Pt discharged to home in private vehicle, ambulatory. Pt A&Ox4, RA, pain controlled.    "

## 2024-07-14 ENCOUNTER — Encounter

## 2024-08-01 ENCOUNTER — Inpatient Hospital Stay: Payer: 59

## 2024-08-01 ENCOUNTER — Ambulatory Visit: Payer: 59

## 2024-09-06 ENCOUNTER — Ambulatory Visit: Payer: Self-pay

## 2024-09-06 DIAGNOSIS — L814 Other melanin hyperpigmentation: Secondary | ICD-10-CM

## 2024-09-06 DIAGNOSIS — D229 Melanocytic nevi, unspecified: Secondary | ICD-10-CM

## 2024-09-06 DIAGNOSIS — L821 Other seborrheic keratosis: Secondary | ICD-10-CM

## 2024-09-06 DIAGNOSIS — D1801 Hemangioma of skin and subcutaneous tissue: Secondary | ICD-10-CM

## 2024-09-06 DIAGNOSIS — L918 Other hypertrophic disorders of the skin: Secondary | ICD-10-CM

## 2024-09-06 DIAGNOSIS — R52 Pain, unspecified: Secondary | ICD-10-CM

## 2024-09-06 NOTE — Progress Notes (Signed)
    Subjective   Calvin HALLAHAN Sr. is a 53 y.o. male who presents for the following: Lesion(s) of concern . Patient is new patient.  Today patient reports: Patient states skin tags underarm, back of neck, and shoulders he would like removed that get irritated with necklaces & clothing.   Review of Systems:    No other skin or systemic complaints except as noted in HPI or Assessment and Plan.  The following portions of the chart were reviewed this encounter and updated as appropriate: medications, allergies, medical history  Relevant Medical History:  n/a   Objective  (SKPE) Well appearing patient in no apparent distress; mood and affect are within normal limits. Examination was performed of the: Waist Up Skin Exam: scalp, head, eyes, ears, nose, lips, neck, chest, axillae, upper extremities, abdomen, back, hands, fingers, fingernails   Examination notable for: - Acrochordon(s): soft, fleshy, skin-colored to tan pedunculated papules located on the neck, bilateral axilla. - Multiple stuck-on brown, tan and grey papillated papules and plaques on back. - Cherry-red vascular papule(s) on back. - Nevi - well demarcated brown macules. Examination limited by: Undergarments, Shoes or socks , and Clothing     Assessment & Plan  (SKAP)   Benign Lesions/ Findings:  -Skin tags -Seborrheic Keratosis - Nevi - Hemangiomas  - Reassurance provided regarding the benign appearance of lesions noted on exam today; no treatment is indicated in the absence of symptoms/changes. - Reinforced importance of photoprotective strategies including liberal and frequent sunscreen use of a broad-spectrum SPF 30 or greater, use of protective clothing, and sun avoidance for prevention of cutaneous malignancy and photoaging.  Counseled patient on the importance of regular self-skin monitoring as well as routine clinical skin examinations as scheduled.     Acrochordons - Benign, patient reassured of the benign  nature of acrochrodons  - Explained that more lesions may appear over time - Patient elected to treat these lesions- states he already checked with insurance and this is covered.    Procedure Note: - Prior to the procedure, reviewed the expected small wound. Also reviewed the risk of leaving a small scar and the small risk of infection.  PROCEDURE - The areas were prepped with isopropyl alcohol. The skin tags were removed using a snip technique. Aluminum chloride was used for hemostasis. Petrolatum and a bandage were applied. The procedure was tolerated well. - Wound care was reviewed with the patient. They were advised to call with any concerns. - Patient opted to not send lesions to pathology, he understands the risks of potential missed malignancy and agrees to this  - Locations: Neck, bilateral axilla  - Total number of treated acrochordons 12     Level of service outlined above   Patient instructions (SKPI)   Procedures, orders, diagnosis for this visit:    There are no diagnoses linked to this encounter.  Return to clinic: Return if symptoms worsen or fail to improve, for TBSE.  I, Almetta Nora, RMA, am acting as scribe for [name], MD.  Documentation: I have reviewed the above documentation for accuracy and completeness, and I agree with the above.  [name], MD

## 2024-09-06 NOTE — Patient Instructions (Signed)
